# Patient Record
Sex: Female | Born: 2000 | Race: Black or African American | Hispanic: No | Marital: Single | Smoking: Never smoker
Health system: Southern US, Community
[De-identification: ages and names within clinical notes are randomized; demographics above are authoritative.]

## PROBLEM LIST (undated history)

## (undated) DIAGNOSIS — Z789 Other specified health status: Secondary | ICD-10-CM

## (undated) HISTORY — DX: Other specified health status: Z78.9

## (undated) HISTORY — PX: NO PAST SURGERIES: SHX2092

---

## 2020-03-05 ENCOUNTER — Encounter: Payer: Self-pay | Admitting: General Practice

## 2020-03-10 ENCOUNTER — Ambulatory Visit (INDEPENDENT_AMBULATORY_CARE_PROVIDER_SITE_OTHER): Payer: Self-pay | Admitting: *Deleted

## 2020-03-10 ENCOUNTER — Other Ambulatory Visit: Payer: Self-pay

## 2020-03-10 ENCOUNTER — Encounter: Payer: Self-pay | Admitting: General Practice

## 2020-03-10 VITALS — BP 119/74 | HR 89 | Temp 98.2°F | Ht 65.0 in | Wt 172.1 lb

## 2020-03-10 DIAGNOSIS — Z3687 Encounter for antenatal screening for uncertain dates: Secondary | ICD-10-CM

## 2020-03-10 DIAGNOSIS — Z3400 Encounter for supervision of normal first pregnancy, unspecified trimester: Secondary | ICD-10-CM

## 2020-03-10 NOTE — Progress Notes (Signed)
   PRENATAL INTAKE SUMMARY  Ms. Whicker presents today New OB Nurse Interview.  OB History    Gravida  1   Para      Term      Preterm      AB      Living        SAB      TAB      Ectopic      Multiple      Live Births             I have reviewed the patient's medical, obstetrical, social, and family histories, medications, and available lab results.  SUBJECTIVE She has no unusual complaints.  OBJECTIVE Initial nurse interview for history/labs (New OB).  EDD: unsure of LMP think is was around 12/02/19 GA: around [redacted]w[redacted]d G1P0 FHT: 165  GENERAL APPEARANCE: alert, well appearing, in no apparent distress, oriented to person, place and time   ASSESSMENT Normal pregnancy  PLAN Prenatal care OB Pnl/HIV  OB Urine Culture GC/CT/PAP to be completed at next visit with Steward Drone, CNM HgbEval/SMA/CF([facility]) to be completed when Medicaid is approved. A1C Ultrasound OB ordered to confirm dating/viability RX for BP and weight scale will be sent when medicaid is approved Continue PNV  Clovis Pu, RN

## 2020-03-10 NOTE — Patient Instructions (Addendum)
Second Trimester of Pregnancy  The second trimester is from week 14 through week 27 (month 4 through 6). This is often the time in pregnancy that you feel your best. Often times, morning sickness has lessened or quit. You may have more energy, and you may get hungry more often. Your unborn baby is growing rapidly. At the end of the sixth month, he or she is about 9 inches long and weighs about 1 pounds. You will likely feel the baby move between 18 and 20 weeks of pregnancy. Follow these instructions at home: Medicines  Take over-the-counter and prescription medicines only as told by your doctor. Some medicines are safe and some medicines are not safe during pregnancy.  Take a prenatal vitamin that contains at least 600 micrograms (mcg) of folic acid.  If you have trouble pooping (constipation), take medicine that will make your stool soft (stool softener) if your doctor approves. Eating and drinking   Eat regular, healthy meals.  Avoid raw meat and uncooked cheese.  If you get low calcium from the food you eat, talk to your doctor about taking a daily calcium supplement.  Avoid foods that are high in fat and sugars, such as fried and sweet foods.  If you feel sick to your stomach (nauseous) or throw up (vomit): ? Eat 4 or 5 small meals a day instead of 3 large meals. ? Try eating a few soda crackers. ? Drink liquids between meals instead of during meals.  To prevent constipation: ? Eat foods that are high in fiber, like fresh fruits and vegetables, whole grains, and beans. ? Drink enough fluids to keep your pee (urine) clear or pale yellow. Activity  Exercise only as told by your doctor. Stop exercising if you start to have cramps.  Do not exercise if it is too hot, too humid, or if you are in a place of great height (high altitude).  Avoid heavy lifting.  Wear low-heeled shoes. Sit and stand up straight.  You can continue to have sex unless your doctor tells you not  to. Relieving pain and discomfort  Wear a good support bra if your breasts are tender.  Take warm water baths (sitz baths) to soothe pain or discomfort caused by hemorrhoids. Use hemorrhoid cream if your doctor approves.  Rest with your legs raised if you have leg cramps or low back pain.  If you develop puffy, bulging veins (varicose veins) in your legs: ? Wear support hose or compression stockings as told by your doctor. ? Raise (elevate) your feet for 15 minutes, 3-4 times a day. ? Limit salt in your food. Prenatal care  Write down your questions. Take them to your prenatal visits.  Keep all your prenatal visits as told by your doctor. This is important. Safety  Wear your seat belt when driving.  Make a list of emergency phone numbers, including numbers for family, friends, the hospital, and police and fire departments. General instructions  Ask your doctor about the right foods to eat or for help finding a counselor, if you need these services.  Ask your doctor about local prenatal classes. Begin classes before month 6 of your pregnancy.  Do not use hot tubs, steam rooms, or saunas.  Do not douche or use tampons or scented sanitary pads.  Do not cross your legs for long periods of time.  Visit your dentist if you have not done so. Use a soft toothbrush to brush your teeth. Floss gently.  Avoid all  smoking, herbs, and alcohol. Avoid drugs that are not approved by your doctor.  Do not use any products that contain nicotine or tobacco, such as cigarettes and e-cigarettes. If you need help quitting, ask your doctor.  Avoid cat litter boxes and soil used by cats. These carry germs that can cause birth defects in the baby and can cause a loss of your baby (miscarriage) or stillbirth. Contact a doctor if:  You have mild cramps or pressure in your lower belly.  You have pain when you pee (urinate).  You have bad smelling fluid coming from your vagina.  You continue to  feel sick to your stomach (nauseous), throw up (vomit), or have watery poop (diarrhea).  You have a nagging pain in your belly area.  You feel dizzy. Get help right away if:  You have a fever.  You are leaking fluid from your vagina.  You have spotting or bleeding from your vagina.  You have severe belly cramping or pain.  You lose or gain weight rapidly.  You have trouble catching your breath and have chest pain.  You notice sudden or extreme puffiness (swelling) of your face, hands, ankles, feet, or legs.  You have not felt the baby move in over an hour.  You have severe headaches that do not go away when you take medicine.  You have trouble seeing. Summary  The second trimester is from week 14 through week 27 (months 4 through 6). This is often the time in pregnancy that you feel your best.  To take care of yourself and your unborn baby, you will need to eat healthy meals, take medicines only if your doctor tells you to do so, and do activities that are safe for you and your baby.  Call your doctor if you get sick or if you notice anything unusual about your pregnancy. Also, call your doctor if you need help with the right food to eat, or if you want to know what activities are safe for you. This information is not intended to replace advice given to you by your health care provider. Make sure you discuss any questions you have with your health care provider. Document Revised: 09/21/2018 Document Reviewed: 07/05/2016 Elsevier Patient Education  2020 [employer].  Warning Signs During Pregnancy A pregnancy lasts about 40 weeks, starting from the first day of your last period until the baby is born. Pregnancy is divided into three phases called trimesters.  The first trimester refers to week 1 through week 13 of pregnancy.  The second trimester is the start of week 14 through the end of week 27.  The third trimester is the start of week 28 until you deliver your  baby. During each trimester of pregnancy, certain signs and symptoms may indicate a problem. Talk with your health care provider about your current health and any medical conditions you have. Make sure you know the symptoms that you should watch for and report. How does this affect me?  Warning signs in the first trimester While some changes during the first trimester may be uncomfortable, most do not represent a serious problem. Let your health care provider know if you have any of the following warning signs in the first trimester:  You cannot eat or drink without vomiting, and this lasts for longer than a day.  You have vaginal bleeding or spotting along with menstrual-like cramping.  You have diarrhea for longer than a day.  You have a fever or other signs of  infection, such as: ? Pain or burning when you urinate. ? Foul smelling or thick or yellowish vaginal discharge. Warning signs in the second trimester As your baby grows and changes during the second trimester, there are additional signs and symptoms that may indicate a problem. These include:  Signs and symptoms of infection, including a fever.  Signs or symptoms of a miscarriage or preterm labor, such as regular contractions, menstrual-like cramping, or lower abdominal pain.  Bloody or watery vaginal discharge or obvious vaginal bleeding.  Feeling like your heart is pounding.  Having trouble breathing.  Nausea, vomiting, or diarrhea that lasts for longer than a day.  Craving non-food items, such as clay, chalk, or dirt. This may be a sign of a very treatable medical condition called pica. Later in your second trimester, watch for signs and symptoms of a serious medical condition called preeclampsia.These include:  Changes in your vision.  A severe headache that does not go away.  Nausea and vomiting. It is also important to notice if your baby stops moving or moves less than usual during this time. Warning signs in  the third trimester As you approach the third trimester, your baby is growing and your body is preparing for the birth of your baby. In your third trimester, be sure to let your health care provider know if:  You have signs and symptoms of infection, including a fever.  You have vaginal bleeding.  You notice that your baby is moving less than usual or is not moving.  You have nausea, vomiting, or diarrhea that lasts for longer than a day.  You have a severe headache that does not go away.  You have vision changes, including seeing spots or having blurry or double vision.  You have increased swelling in your hands or face. How does this affect my baby? Throughout your pregnancy, always report any of the warning signs of a problem to your health care provider. This can help prevent complications that may affect your baby, including:  Increased risk for premature birth.  Infection that may be transmitted to your baby.  Increased risk for stillbirth. Contact a health care provider if:  You have any of the warning signs of a problem for the current trimester of your pregnancy.  Any of the following apply to you during any trimester of pregnancy: ? You have strong emotions, such as sadness or anxiety, that interfere with work or personal relationships. ? You feel unsafe in your home and need help finding a safe place to live. ? You are using tobacco products, alcohol, or drugs and you need help to stop. Get help right away if: You have signs or symptoms of labor before 37 weeks of pregnancy. These include:  Contractions that are 5 minutes or less apart, or that increase in frequency, intensity, or length.  Sudden, sharp abdominal pain or low back pain.  Uncontrolled gush or trickle of fluid from your vagina. Summary  A pregnancy lasts about 40 weeks, starting from the first day of your last period until the baby is born. Pregnancy is divided into three phases called trimesters.  Each trimester has warning signs to watch for.  Always report any warning signs to your health care provider in order to prevent complications that may affect both you and your baby.  Talk with your health care provider about your current health and any medical conditions you have. Make sure you know the symptoms that you should watch for and report. This information  is not intended to replace advice given to you by your health care provider. Make sure you discuss any questions you have with your health care provider. Document Revised: 09/18/2018 Document Reviewed: 03/16/2017 Elsevier Patient Education  2020 [employer].  How to Take Your Blood Pressure You can take your blood pressure at home with a machine. You may need to check your blood pressure at home:  To check if you have high blood pressure (hypertension).  To check your blood pressure over time.  To make sure your blood pressure medicine is working. Supplies needed: You will need a blood pressure machine, or monitor. You can buy one at a drugstore or online. When choosing one:  Choose one with an arm cuff.  Choose one that wraps around your upper arm. Only one finger should fit between your arm and the cuff.  Do not choose one that measures your blood pressure from your wrist or finger. Your doctor can suggest a monitor. How to prepare Avoid these things for 30 minutes before checking your blood pressure:  Drinking caffeine.  Drinking alcohol.  Eating.  Smoking.  Exercising. Five minutes before checking your blood pressure:  Pee.  Sit in a dining chair. Avoid sitting in a soft couch or armchair.  Be quiet. Do not talk. How to take your blood pressure Follow the instructions that came with your machine. If you have a digital blood pressure monitor, these may be the instructions: 1. Sit up straight. 2. Place your feet on the floor. Do not cross your ankles or legs. 3. Rest your left arm at the level of your  heart. You may rest it on a table, desk, or chair. 4. Pull up your shirt sleeve. 5. Wrap the blood pressure cuff around the upper part of your left arm. The cuff should be 1 inch (2.5 cm) above your elbow. It is best to wrap the cuff around bare skin. 6. Fit the cuff snugly around your arm. You should be able to place only one finger between the cuff and your arm. 7. Put the cord inside the groove of your elbow. 8. Press the power button. 9. Sit quietly while the cuff fills with air and loses air. 10. Write down the numbers on the screen. 11. Wait 2-3 minutes and then repeat steps 1-10. What do the numbers mean? Two numbers make up your blood pressure. The first number is called systolic pressure. The second is called diastolic pressure. An example of a blood pressure reading is "120 over 80" (or 120/80). If you are an adult and do not have a medical condition, use this guide to find out if your blood pressure is normal: Normal  First number: below 120.  Second number: below 80. Elevated  First number: 120-129.  Second number: below 80. Hypertension stage 1  First number: 130-139.  Second number: 80-89. Hypertension stage 2  First number: 140 or above.  Second number: 90 or above. Your blood pressure is above normal even if only the top or bottom number is above normal. Follow these instructions at home:  Check your blood pressure as often as your doctor tells you to.  Take your monitor to your next doctor's appointment. Your doctor will: ? Make sure you are using it correctly. ? Make sure it is working right.  Make sure you understand what your blood pressure numbers should be.  Tell your doctor if your medicines are causing side effects. Contact a doctor if:  Your blood pressure keeps  being high. Get help right away if:  Your first blood pressure number is higher than 180.  Your second blood pressure number is higher than 120. This information is not intended to  replace advice given to you by your health care provider. Make sure you discuss any questions you have with your health care provider. Document Revised: 05/12/2017 Document Reviewed: 11/06/2015 Elsevier Patient Education  2020 [employer]  Genetic Testing During Pregnancy Genetic testing during pregnancy is also called prenatal genetic testing. This type of testing can determine if your baby is at risk of being born with a disorder caused by abnormal genes or chromosomes (genetic disorder). Chromosomes contain genes that control how your baby will develop in your womb. There are many different genetic disorders. Examples of genetic disorders that may be found through genetic testing include Down syndrome and cystic fibrosis. Gene changes (mutations) can be passed down through families. Genetic testing is offered to all women before or during pregnancy. You can choose whether to have genetic testing. Why is genetic testing done? Genetic testing is done during pregnancy to find out whether your child is at risk for a genetic disorder. Having genetic testing allows you to:  Discuss your test results and options with a genetic counselor.  Prepare for a baby that may be born with a genetic disorder. Learning about the disorder ahead of time helps you be better prepared to manage it. Your health care providers can also be prepared in case your baby requires special care before or after birth.  Consider whether you want to continue with the pregnancy. In some cases, genetic testing may be done to learn about the traits a child will inherit. Types of genetic tests There are two basic types of genetic testing. Screening tests indicate whether your developing baby (fetus) is at higher risk for a genetic disorder. Diagnostic tests check actual fetal cells to diagnose a genetic disorder. Screening tests     Screening tests will not harm your baby. They are recommended for all pregnant women. Types of  screening tests include:  Carrier screening. This test involves checking genes from both parents by testing their blood or saliva. The test checks to find out if the parents carry a genetic mutation that may be passed to a baby. In most cases, both parents must carry the mutation for a baby to be at risk.  First trimester screening. This test combines a blood test with sound wave imaging of your baby (fetal ultrasound). This screening test checks for a risk of Down syndrome or other defects caused by having extra chromosomes. It also checks for defects of the heart, abdomen, or skeleton.  Second trimester screening also combines a blood test with a fetal ultrasound exam. It checks for a risk of genetic defects of the face, brain, spine, heart, or limbs.  Combined or sequential screening. This type of testing combines the results of first and second trimester screening. This type of testing may be more accurate than first or second trimester screening alone.  Cell-free DNA testing. This is a blood test that detects cells released by the placenta that get into the mother's blood. It can be used to check for a risk of Down syndrome, other extra chromosome syndromes, and disorders caused by abnormal numbers of sex chromosomes. This test can be done any time after 10 weeks of pregnancy.  Diagnostic tests Diagnostic tests carry slight risks of problems, including bleeding, infection, and loss of the pregnancy. These tests are done  only if your baby is at risk for a genetic disorder. You may meet with a genetic counselor to discuss the risks and benefits before having diagnostic tests. Examples of diagnostic tests include:  Chorionic villus sampling (CVS). This involves a procedure to remove and test a sample of cells taken from the placenta. The procedure may be done between 10 and 12 weeks of pregnancy.  Amniocentesis. This involves a procedure to remove and test a sample of fluid (amniotic fluid) and  cells from the sac that surrounds the developing baby. The procedure may be done between 15 and 20 weeks of pregnancy. What do the results mean? For a screening test:  If the results are negative, it often means that your child is not at higher risk. There is still a slight chance your child could have a genetic disorder.  If the results are positive, it does not mean your child will have a genetic disorder. It may mean that your child has a higher-than-normal risk for a genetic disorder. In that case, you may want to talk with a genetic counselor about whether you should have diagnostic genetic tests. For a diagnostic test:  If the result is negative, it is unlikely that your child will have a genetic disorder.  If the test is positive for a genetic disorder, it is likely that your child will have the disorder. The test may not tell how severe the disorder will be. Talk with your health care provider about your options. Questions to ask your health care provider Before talking to your health care provider about genetic testing, find out if there is a history of genetic disorders in your family. It may also help to know your family's ethnic origins. Then ask your health care provider the following questions:  Is my baby at risk for a genetic disorder?  What are the benefits of having genetic screening?  What tests are best for me and my baby?  What are the risks of each test?  If I get a positive result on a screening test, what is the next step?  Should I meet with a genetic counselor before having a diagnostic test?  Should my partner or other members of my family be tested?  How much do the tests cost? Will my insurance cover the testing? Summary  Genetic testing is done during pregnancy to find out whether your child is at risk for a genetic disorder.  Genetic testing is offered to all women before or during pregnancy. You can choose whether to have genetic testing.  There are  two basic types of genetic testing. Screening tests indicate whether your developing baby (fetus) is at higher risk for a genetic disorder. Diagnostic tests check actual fetal cells to diagnose a genetic disorder.  If a diagnostic genetic test is positive, talk with your health care provider about your options. This information is not intended to replace advice given to you by your health care provider. Make sure you discuss any questions you have with your health care provider. Document Revised: 09/20/2018 Document Reviewed: 08/14/2017 Elsevier Patient Education  2020 [employer].

## 2020-03-11 LAB — CBC/D/PLT+RPR+RH+ABO+RUB AB...
Antibody Screen: NEGATIVE
Basophils Absolute: 0.1 10*3/uL (ref 0.0–0.2)
Basos: 1 %
EOS (ABSOLUTE): 0.2 10*3/uL (ref 0.0–0.4)
Eos: 2 %
HCV Ab: <0.1 s/co ratio (ref 0.0–0.9)
HIV Screen 4th Generation wRfx: NONREACTIVE
Hematocrit: 33.2 % — ABNORMAL LOW (ref 34.0–46.6)
Hemoglobin: 10.7 g/dL — ABNORMAL LOW (ref 11.1–15.9)
Hepatitis B Surface Ag: NEGATIVE
Immature Grans (Abs): 0 10*3/uL (ref 0.0–0.1)
Immature Granulocytes: 0 %
Lymphocytes Absolute: 1.9 10*3/uL (ref 0.7–3.1)
Lymphs: 22 %
MCH: 26.6 pg (ref 26.6–33.0)
MCHC: 32.2 g/dL (ref 31.5–35.7)
MCV: 83 fL (ref 79–97)
Monocytes Absolute: 0.5 10*3/uL (ref 0.1–0.9)
Monocytes: 6 %
Neutrophils Absolute: 6 10*3/uL (ref 1.4–7.0)
Neutrophils: 69 %
Platelets: 373 10*3/uL (ref 150–450)
RBC: 4.02 x10E6/uL (ref 3.77–5.28)
RDW: 14.3 % (ref 11.7–15.4)
RPR Ser Ql: NONREACTIVE
Rh Factor: POSITIVE
Rubella Antibodies, IGG: 3.4 index (ref >0.99)
WBC: 8.7 10*3/uL (ref 3.4–10.8)

## 2020-03-11 LAB — HCV INTERPRETATION

## 2020-03-11 LAB — HEMOGLOBIN A1C
Est. average glucose Bld gHb Est-mCnc: 108 mg/dL
Hgb A1c MFr Bld: 5.4 % (ref 4.8–5.6)

## 2020-03-12 LAB — CULTURE, OB URINE

## 2020-03-12 LAB — URINE CULTURE, OB REFLEX

## 2020-03-18 ENCOUNTER — Other Ambulatory Visit: Payer: Medicaid Other

## 2020-04-01 ENCOUNTER — Encounter: Payer: Medicaid Other | Admitting: Certified Nurse Midwife

## 2020-04-17 ENCOUNTER — Other Ambulatory Visit (HOSPITAL_COMMUNITY)
Admission: RE | Admit: 2020-04-17 | Discharge: 2020-04-17 | Disposition: A | Discharge status: Home / Self Care | Payer: Medicaid Other | Source: Ambulatory Visit

## 2020-04-17 ENCOUNTER — Ambulatory Visit (INDEPENDENT_AMBULATORY_CARE_PROVIDER_SITE_OTHER): Payer: Medicaid Other

## 2020-04-17 ENCOUNTER — Other Ambulatory Visit: Payer: Self-pay

## 2020-04-17 ENCOUNTER — Encounter: Payer: Self-pay | Admitting: General Practice

## 2020-04-17 VITALS — BP 135/75 | HR 82 | Wt 175.4 lb

## 2020-04-17 DIAGNOSIS — B3731 Acute candidiasis of vulva and vagina: Secondary | ICD-10-CM

## 2020-04-17 DIAGNOSIS — Z3A19 19 weeks gestation of pregnancy: Secondary | ICD-10-CM

## 2020-04-17 DIAGNOSIS — Z3401 Encounter for supervision of normal first pregnancy, first trimester: Secondary | ICD-10-CM | POA: Insufficient documentation

## 2020-04-17 DIAGNOSIS — B373 Candidiasis of vulva and vagina: Secondary | ICD-10-CM

## 2020-04-17 DIAGNOSIS — Z124 Encounter for screening for malignant neoplasm of cervix: Secondary | ICD-10-CM

## 2020-04-17 MED ORDER — PREPLUS 27-1 MG PO TABS: 1.0000 | ORAL_TABLET | Freq: Every day | ORAL | 13 refills | Status: AC

## 2020-04-17 MED ORDER — TERCONAZOLE 0.4 % VA CREA: 1.0000 | TOPICAL_CREAM | Freq: Every day | VAGINAL | 0 refills | Status: DC

## 2020-04-17 MED ORDER — ASPIRIN EC 81 MG PO TBEC: 81.0000 mg | DELAYED_RELEASE_TABLET | Freq: Every day | ORAL | 2 refills | Status: AC

## 2020-04-17 MED ORDER — BLOOD PRESSURE KIT DEVI: 0 refills | Status: AC

## 2020-04-17 NOTE — Patient Instructions (Addendum)
Glucose Tolerance Test During Pregnancy Why am I having this test? The glucose tolerance test (GTT) is done to check how your body processes sugar (glucose). This is one of several tests used to diagnose diabetes that develops during pregnancy (gestational diabetes mellitus). Gestational diabetes is a temporary form of diabetes that some women develop during pregnancy. It usually occurs during the second trimester of pregnancy and goes away after delivery. Testing (screening) for gestational diabetes usually occurs between 24 and 28 weeks of pregnancy. You may have the GTT test after having a 1-hour glucose screening test if the results from that test indicate that you may have gestational diabetes. You may also have this test if:  You have a history of gestational diabetes.  You have a history of giving birth to very large babies or have experienced repeated fetal loss (stillbirth).  You have signs and symptoms of diabetes, such as: ? Changes in your vision. ? Tingling or numbness in your hands or feet. ? Changes in hunger, thirst, and urination that are not otherwise explained by your pregnancy. What is being tested? This test measures the amount of glucose in your blood at different times during a period of 3 hours. This indicates how well your body is able to process glucose. What kind of sample is taken?  Blood samples are required for this test. They are usually collected by inserting a needle into a blood vessel. How do I prepare for this test?  For 3 days before your test, eat normally. Have plenty of carbohydrate-rich foods.  Follow instructions from your health care provider about: ? Eating or drinking restrictions on the day of the test. You may be asked to not eat or drink anything other than water (fast) starting 8-10 hours before the test. ? Changing or stopping your regular medicines. Some medicines may interfere with this test. Tell a health care provider about:  All  medicines you are taking, including vitamins, herbs, eye drops, creams, and over-the-counter medicines.  Any blood disorders you have.  Any surgeries you have had.  Any medical conditions you have. What happens during the test? First, your blood glucose will be measured. This is referred to as your fasting blood glucose, since you fasted before the test. Then, you will drink a glucose solution that contains a certain amount of glucose. Your blood glucose will be measured again 1, 2, and 3 hours after drinking the solution. This test takes about 3 hours to complete. You will need to stay at the testing location during this time. During the testing period:  Do not eat or drink anything other than the glucose solution.  Do not exercise.  Do not use any products that contain nicotine or tobacco, such as cigarettes and e-cigarettes. If you need help stopping, ask your health care provider. The testing procedure may vary among health care providers and hospitals. How are the results reported? Your results will be reported as milligrams of glucose per deciliter of blood (mg/dL) or millimoles per liter (mmol/L). Your health care provider will compare your results to normal ranges that were established after testing a large group of people (reference ranges). Reference ranges may vary among labs and hospitals. For this test, common reference ranges are:  Fasting: less than 95-105 mg/dL (5.3-5.8 mmol/L).  1 hour after drinking glucose: less than 180-190 mg/dL (10.0-10.5 mmol/L).  2 hours after drinking glucose: less than 155-165 mg/dL (8.6-9.2 mmol/L).  3 hours after drinking glucose: 140-145 mg/dL (7.8-8.1 mmol/L). What do the   results mean? Results within reference ranges are considered normal, meaning that your glucose levels are well-controlled. If two or more of your blood glucose levels are high, you may be diagnosed with gestational diabetes. If only one level is high, your health care  provider may suggest repeat testing or other tests to confirm a diagnosis. Talk with your health care provider about what your results mean. Questions to ask your health care provider Ask your health care provider, or the department that is doing the test:  When will my results be ready?  How will I get my results?  What are my treatment options?  What other tests do I need?  What are my next steps? Summary  The glucose tolerance test (GTT) is one of several tests used to diagnose diabetes that develops during pregnancy (gestational diabetes mellitus). Gestational diabetes is a temporary form of diabetes that some women develop during pregnancy.  You may have the GTT test after having a 1-hour glucose screening test if the results from that test indicate that you may have gestational diabetes. You may also have this test if you have any symptoms or risk factors for gestational diabetes.  Talk with your health care provider about what your results mean. This information is not intended to replace advice given to you by your health care provider. Make sure you discuss any questions you have with your health care provider. Document Revised: 09/20/2018 Document Reviewed: 01/09/2017 Elsevier Patient Education  2020 Else[employer] Inc.  Iron-Rich Diet  Iron is a mineral that helps your body to produce hemoglobin. Hemoglobin is a protein in red blood cells that carries oxygen to your body's tissues. Eating too little iron may cause you to feel weak and tired, and it can increase your risk of infection. Iron is naturally found in many foods, and many foods have iron added to them (iron-fortified foods). You may need to follow an iron-rich diet if you do not have enough iron in your body due to certain medical conditions. The amount of iron that you need each day depends on your age, your sex, and any medical conditions you have. Follow instructions from your health care provider or a diet and  nutrition specialist (dietitian) about how much iron you should eat each day. What are tips for following this plan? Reading food labels  Check food labels to see how many milligrams (mg) of iron are in each serving. Cooking  Cook foods in pots and pans that are made from iron.  Take these steps to make it easier for your body to absorb iron from certain foods: ? Soak beans overnight before cooking. ? Soak whole grains overnight and drain them before using. ? Ferment flours before baking, such as by using yeast in bread dough. Meal planning  When you eat foods that contain iron, you should eat them with foods that are high in vitamin C. These include oranges, peppers, tomatoes, potatoes, and mango. Vitamin C helps your body to absorb iron. General information  Take iron supplements only as told by your health care provider. An overdose of iron can be life-threatening. If you were prescribed iron supplements, take them with orange juice or a vitamin C supplement.  When you eat iron-fortified foods or take an iron supplement, you should also eat foods that naturally contain iron, such as meat, poultry, and fish. Eating naturally iron-rich foods helps your body to absorb the iron that is added to other foods or contained in a supplement.  Certain  foods and drinks prevent your body from absorbing iron properly. Avoid eating these foods in the same meal as iron-rich foods or with iron supplements. These foods include: ? Coffee, black tea, and red wine. ? Milk, dairy products, and foods that are high in calcium. ? Beans and soybeans. ? Whole grains. What foods should I eat? Fruits Prunes. Raisins. Eat fruits high in vitamin C, such as oranges, grapefruits, and strawberries, alongside iron-rich foods. Vegetables Spinach (cooked). Green peas. Broccoli. Fermented vegetables. Eat vegetables high in vitamin C, such as leafy greens, potatoes, bell peppers, and tomatoes, alongside iron-rich  foods. Grains Iron-fortified breakfast cereal. Iron-fortified whole-wheat bread. Enriched rice. Sprouted grains. Meats and other proteins Beef liver. Oysters. Beef. Shrimp. [place]. Chicken. Tuna. Sardines. Chickpeas. Nuts. Tofu. Pumpkin seeds. Beverages Tomato juice. Fresh orange juice. Prune juice. Hibiscus tea. Fortified instant breakfast shakes. Sweets and desserts Blackstrap molasses. Seasonings and condiments Tahini. Fermented soy sauce. Other foods Wheat germ. The items listed above may not be a complete list of recommended foods and beverages. Contact a dietitian for more information. What foods should I avoid? Grains Whole grains. Bran cereal. Bran flour. Oats. Meats and other proteins Soybeans. Products made from soy protein. Black beans. Lentils. Mung beans. Split peas. Dairy Milk. Cream. Cheese. Yogurt. Cottage cheese. Beverages Coffee. Black tea. Red wine. Sweets and desserts Cocoa. Chocolate. Ice cream. Other foods Basil. Oregano. Large amounts of parsley. The items listed above may not be a complete list of foods and beverages to avoid. Contact a dietitian for more information. Summary  Iron is a mineral that helps your body to produce hemoglobin. Hemoglobin is a protein in red blood cells that carries oxygen to your body's tissues.  Iron is naturally found in many foods, and many foods have iron added to them (iron-fortified foods).  When you eat foods that contain iron, you should eat them with foods that are high in vitamin C. Vitamin C helps your body to absorb iron.  Certain foods and drinks prevent your body from absorbing iron properly, such as whole grains and dairy products. You should avoid eating these foods in the same meal as iron-rich foods or with iron supplements. This information is not intended to replace advice given to you by your health care provider. Make sure you discuss any questions you have with your health care provider. Document Revised:  05/12/2017 Document Reviewed: 04/25/2017 Elsevier Patient Education  2020 [employer].
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
## 2020-04-19 LAB — AFP, SERUM, OPEN SPINA BIFIDA
AFP MoM: 1.75
AFP Value: 74 ng/mL
Gest. Age on Collection Date: 19.4 weeks
Maternal Age At EDD: 22.1 yr
OSBR Risk 1 IN: 861
Test Results:: NEGATIVE
Weight: 174 [lb_av]

## 2020-04-21 ENCOUNTER — Encounter: Payer: Self-pay | Admitting: General Practice

## 2020-04-21 LAB — CYTOLOGY - PAP
Comment: NEGATIVE
Diagnosis: NEGATIVE
High risk HPV: NEGATIVE

## 2020-04-21 LAB — CERVICOVAGINAL ANCILLARY ONLY
Bacterial Vaginitis (gardnerella): NEGATIVE
Candida Glabrata: NEGATIVE
Candida Vaginitis: POSITIVE — AB
Chlamydia: NEGATIVE
Comment: NEGATIVE
Comment: NEGATIVE
Comment: NEGATIVE
Comment: NEGATIVE
Comment: NEGATIVE
Comment: NORMAL
Neisseria Gonorrhea: NEGATIVE
Trichomonas: NEGATIVE

## 2020-05-13 ENCOUNTER — Other Ambulatory Visit: Payer: Self-pay

## 2020-05-13 ENCOUNTER — Ambulatory Visit: Payer: Medicaid Other

## 2020-05-13 DIAGNOSIS — Z3401 Encounter for supervision of normal first pregnancy, first trimester: Secondary | ICD-10-CM | POA: Insufficient documentation

## 2020-05-14 ENCOUNTER — Other Ambulatory Visit: Payer: Self-pay | Admitting: *Deleted

## 2020-05-14 DIAGNOSIS — Z362 Encounter for other antenatal screening follow-up: Secondary | ICD-10-CM

## 2020-05-15 ENCOUNTER — Telehealth (INDEPENDENT_AMBULATORY_CARE_PROVIDER_SITE_OTHER): Payer: Medicaid Other

## 2020-05-15 DIAGNOSIS — Z3A23 23 weeks gestation of pregnancy: Secondary | ICD-10-CM

## 2020-05-15 DIAGNOSIS — Z3402 Encounter for supervision of normal first pregnancy, second trimester: Secondary | ICD-10-CM

## 2020-05-15 DIAGNOSIS — Z3400 Encounter for supervision of normal first pregnancy, unspecified trimester: Secondary | ICD-10-CM

## 2020-05-15 MED ORDER — GOJJI WEIGHT SCALE MISC: 1.0000 | Freq: Every day | 0 refills | Status: AC | PRN

## 2020-05-15 MED ORDER — TERCONAZOLE 0.4 % VA CREA
1.0000 | TOPICAL_CREAM | Freq: Every day | VAGINAL | 0 refills | Status: DC
Start: 2020-05-15 — End: 2020-06-10

## 2020-05-15 NOTE — Patient Instructions (Signed)
Glucose Tolerance Test During Pregnancy Why am I having this test? The glucose tolerance test (GTT) is done to check how your body processes sugar (glucose). This is one of several tests used to diagnose diabetes that develops during pregnancy (gestational diabetes mellitus). Gestational diabetes is a temporary form of diabetes that some women develop during pregnancy. It usually occurs during the second trimester of pregnancy and goes away after delivery. Testing (screening) for gestational diabetes usually occurs between 24 and 28 weeks of pregnancy. You may have the GTT test after having a 1-hour glucose screening test if the results from that test indicate that you may have gestational diabetes. You may also have this test if:  You have a history of gestational diabetes.  You have a history of giving birth to very large babies or have experienced repeated fetal loss (stillbirth).  You have signs and symptoms of diabetes, such as: ? Changes in your vision. ? Tingling or numbness in your hands or feet. ? Changes in hunger, thirst, and urination that are not otherwise explained by your pregnancy. What is being tested? This test measures the amount of glucose in your blood at different times during a period of 3 hours. This indicates how well your body is able to process glucose. What kind of sample is taken?  Blood samples are required for this test. They are usually collected by inserting a needle into a blood vessel. How do I prepare for this test?  For 3 days before your test, eat normally. Have plenty of carbohydrate-rich foods.  Follow instructions from your health care provider about: ? Eating or drinking restrictions on the day of the test. You may be asked to not eat or drink anything other than water (fast) starting 8-10 hours before the test. ? Changing or stopping your regular medicines. Some medicines may interfere with this test. Tell a health care provider about:  All  medicines you are taking, including vitamins, herbs, eye drops, creams, and over-the-counter medicines.  Any blood disorders you have.  Any surgeries you have had.  Any medical conditions you have. What happens during the test? First, your blood glucose will be measured. This is referred to as your fasting blood glucose, since you fasted before the test. Then, you will drink a glucose solution that contains a certain amount of glucose. Your blood glucose will be measured again 1, 2, and 3 hours after drinking the solution. This test takes about 3 hours to complete. You will need to stay at the testing location during this time. During the testing period:  Do not eat or drink anything other than the glucose solution.  Do not exercise.  Do not use any products that contain nicotine or tobacco, such as cigarettes and e-cigarettes. If you need help stopping, ask your health care provider. The testing procedure may vary among health care providers and hospitals. How are the results reported? Your results will be reported as milligrams of glucose per deciliter of blood (mg/dL) or millimoles per liter (mmol/L). Your health care provider will compare your results to normal ranges that were established after testing a large group of people (reference ranges). Reference ranges may vary among labs and hospitals. For this test, common reference ranges are:  Fasting: less than 95-105 mg/dL (5.3-5.8 mmol/L).  1 hour after drinking glucose: less than 180-190 mg/dL (10.0-10.5 mmol/L).  2 hours after drinking glucose: less than 155-165 mg/dL (8.6-9.2 mmol/L).  3 hours after drinking glucose: 140-145 mg/dL (7.8-8.1 mmol/L). What do the   results mean? Results within reference ranges are considered normal, meaning that your glucose levels are well-controlled. If two or more of your blood glucose levels are high, you may be diagnosed with gestational diabetes. If only one level is high, your health care  provider may suggest repeat testing or other tests to confirm a diagnosis. Talk with your health care provider about what your results mean. Questions to ask your health care provider Ask your health care provider, or the department that is doing the test:  When will my results be ready?  How will I get my results?  What are my treatment options?  What other tests do I need?  What are my next steps? Summary  The glucose tolerance test (GTT) is one of several tests used to diagnose diabetes that develops during pregnancy (gestational diabetes mellitus). Gestational diabetes is a temporary form of diabetes that some women develop during pregnancy.  You may have the GTT test after having a 1-hour glucose screening test if the results from that test indicate that you may have gestational diabetes. You may also have this test if you have any symptoms or risk factors for gestational diabetes.  Talk with your health care provider about what your results mean. This information is not intended to replace advice given to you by your health care provider. Make sure you discuss any questions you have with your health care provider. Document Revised: 09/20/2018 Document Reviewed: 01/09/2017 Elsevier Patient Education  2020 Else[employer] Inc.  

## 2020-05-15 NOTE — Progress Notes (Signed)
   TELEHEALTH OBSTETRICS VISIT ENCOUNTER NOTE  I connected with Daney Tassin on 05/15/20 at 10:30 AM EST by telephone at home and verified that I am speaking with the correct person using two identifiers.   I discussed the limitations, risks, security and privacy concerns of performing an evaluation and management service by telephone and the availability of in person appointments. I also discussed with the patient that there may be a patient responsible charge related to this service. The patient expressed understanding and agreed to proceed.  Subjective:  Marlicia Sroka is a 19 y.o. G1P0 at [redacted]w[redacted]d being followed for ongoing prenatal care.  She is currently monitored for the following issues for this low-risk pregnancy and has Supervision of normal first pregnancy, antepartum on their problem list.  Patient reports no complaints. Reports fetal movement. Denies any contractions, bleeding or leaking of fluid.   The following portions of the patient's history were reviewed and updated as appropriate: allergies, current medications, past family history, past medical history, past social history, past surgical history and problem list.   Objective:   General:  Alert, oriented and cooperative.   Mental Status: Normal mood and affect perceived. Normal judgment and thought content.  Rest of physical exam deferred due to type of encounter  Assessment and Plan:  Pregnancy: G1P0 at [redacted]w[redacted]d  1. Supervision of normal first pregnancy, antepartum -Anticipatory guidance for upcoming appts.  -Reviewed Glucola appt preparation including fasting the night before and morning of.   -Discussed anticipated office time of 2.5-3 hours.  -Reviewed blood draw procedures and labs which also include check of iron level.  -Discussed how results of GTT are handled including diabetic education and BS testing for abnormal results and routine care for normal results.   - Misc. Devices (GOJJI WEIGHT SCALE) MISC; 1  Device by Does not apply route daily as needed. To weight self daily as needed at home. ICD-10 code: O72.90  Dispense: 1 each; Refill: 0  2. [redacted] weeks gestation of pregnancy -Doing well  Preterm labor symptoms and general obstetric precautions including but not limited to vaginal bleeding, contractions, leaking of fluid and fetal movement were reviewed in detail with the patient.  I discussed the assessment and treatment plan with the patient. The patient was provided an opportunity to ask questions and all were answered. The patient agreed with the plan and demonstrated an understanding of the instructions. The patient was advised to call back or seek an in-person office evaluation/go to [facility] at [facility] for any urgent or concerning symptoms. Please refer to After Visit Summary for other counseling recommendations.   I provided 10 minutes of non-face-to-face time during this encounter.  No follow-ups on file.  Future Appointments  Date Time Provider Department Center  06/15/2020 11:00 AM [facility] NURSE [facility]  06/15/2020 11:15 AM [facility] US2 [facility]    Cherre Robins, CNM [facility] for [employer], [facility]

## 2020-06-10 ENCOUNTER — Other Ambulatory Visit: Payer: Self-pay

## 2020-06-10 ENCOUNTER — Encounter: Payer: Self-pay | Admitting: Advanced Practice Midwife

## 2020-06-10 ENCOUNTER — Ambulatory Visit (INDEPENDENT_AMBULATORY_CARE_PROVIDER_SITE_OTHER): Payer: Medicaid Other | Admitting: Advanced Practice Midwife

## 2020-06-10 VITALS — BP 123/70 | HR 109 | Temp 98.1°F | Wt 190.4 lb

## 2020-06-10 DIAGNOSIS — Z3400 Encounter for supervision of normal first pregnancy, unspecified trimester: Secondary | ICD-10-CM

## 2020-06-10 DIAGNOSIS — Z3A28 28 weeks gestation of pregnancy: Secondary | ICD-10-CM

## 2020-06-10 MED ORDER — FAMOTIDINE 40 MG PO TABS: 40.0000 mg | ORAL_TABLET | Freq: Every day | ORAL | 11 refills | Status: AC

## 2020-06-10 NOTE — Patient Instructions (Signed)
COVID-19 Vaccination if You Are Pregnant or Breastfeeding ° °The Society for Maternal-Fetal Medicine (SMFM) and other pregnancy experts recommend that pregnant and lactating people be vaccinated against COVID-19. The Centers for Disease Control and Prevention (CDC) also recommend vaccination for “all people aged 21 years and older, including people who are pregnant, breastfeeding, trying to get pregnant now, or might become pregnant in the future.” Vaccination is the best way to reduce the risks of COVID-19 infection and COVID-related complications for both you and your baby. ° °Three vaccines are available to prevent COVID-19: °• The two-dose Pfizer vaccine for people 12 years and older--APPROVED by the [place] Food and Drug Administration on February 03, 2020 °• The two-dose Moderna vaccine for people 18 years and older--AUTHORIZED for emergency use °• The one-dose Johnson & Johnson vaccine for people 18 years and older (you may also see this vaccine referred to as the “Janssen vaccine”)--AUTHORIZED for emergency use ° °For those receiving the Pfizer and Moderna vaccines, the second dose is given 21 days (Pfizer) and 28 days (Moderna) after the first dose. The Johnson & Johnson vaccine is only one dose. ° °Information for Pregnant Individuals °If you are pregnant or planning to become pregnant and are thinking about getting vaccinated, consider talking with your health care professional about the vaccine.  ° °To help with your decision, you should consider the following key points: °Anyone can get the COVID vaccines free of charge regardless of immigration status or whether they have insurance. You may be asked for your social security number, but it is  °NOT required to get vaccinated. ° °What are benefits of getting the COVID-19 vaccines during pregnancy?  °• The vaccines can help protect you from getting COVID-19. With the two-dose vaccines, you must get both doses for maximum effectiveness. It’s not yet known how  long protection lasts. ° °• Another potential benefit is that getting the vaccine while pregnant may help you pass antiCOVID-19 antibodies to your baby. In numerous studies of vaccinated moms, antibodies were found in the umbilical cord blood of babies and in the mother’s breastmilk. ° °• The CDC, along with other federal partners, are monitoring people who have been vaccinated for serious side effects. So far, more than 139,000 pregnant people have been °vaccinated. No unexpected pregnancy or fetal problems have occurred. There have been no reports of any increased risk of pregnancy loss, growth problems, or birth defects. ° °• A safe vaccine is generally considered one in which the benefits of being vaccinated outweigh the risks. The current vaccines are not live vaccines. There is only a very small  °chance that they cross the placenta, so it’s unlikely that they even reach the fetus. Vaccines don’t affect future fertility. The only people who should NOT get vaccinated are those who have had a severe allergic reaction to vaccines in the past or any vaccine ingredients. ° °• Side effects may occur in the first 3 days after getting vaccinated.1 These include mild to moderate fever, headache, and muscle aches. Side effects may be worse after the second dose of the Pfizer and Moderna vaccines. Fever should be avoided during pregnancy,especially in the first trimester. Those who develop a fever after vaccination can take °acetaminophen (Tylenol). This medication is safe to use during pregnancy and does not  affect how the vaccine works.  ° °What are the known risks of getting COVID-19 during pregnancy?  °About 1 to 3 per 1,000 pregnant women with COVID-19 will develop severe disease. Compared with those who   aren’t pregnant, pregnant people infected by the COVID-19 virus: °• Are 3 times more likely to need ICU care °• Are 2 to 3 times more likely to need advanced life support and a breathing tube  °• Have a small  increased risk of dying due to COVID-19 °They may also be at increased risk of stillbirth and preterm birth. ° °What is my risk of getting COVID-19?  °Your risk of getting COVID-19 depends on the chance that you will come into contact with another infected person. The risk may be higher if you live in a community where there is a lot of COVID-19 infection or work in healthcare or another high-contact setting.  ° °What is my risk for severe complications if I get COVID-19?  °Data show that older pregnant women; those with preexisting health conditions, such as a body mass index higher than 35 kg/m2, diabetes, and heart disorders; and Black or Latinx women have an especially increased risk of severe disease and death from COVID-19. °  °If you still have questions about the vaccines or need more information, ask your health care provider or go to the Centers for Disease Control and Prevention’s COVID-19 vaccine webpage.  ° °An Update on the Johnson & Johnson Vaccine  ° °In April 2021, the FDA and CDC called for a brief pause to use of the Johnson & Johnson vaccine. They did so after reports of a severe side effect in a very small number of women younger than age 50 following vaccination. This side effect, called thrombosis with thrombocytopenia syndrome (TTS), causes blood clots (thrombosis) combined with low levels of platelets (thrombocytopenia). ° °TTS following the Johnson & Johnson vaccine is extremely rare. At the time of this update, it has occurred in only 7 people per 1 million Johnson & Johnson shots given. According to the CDC, being on hormonal birth control (the pill, patch, or ring), pregnancy, breastfeeding, or being recently pregnant does not make you more likely to develop TTS after getting the Johnson & Johnson vaccine. The pause was lifted on October 04, 2019, after the FDA and CDC determined that the known benefits of the Johnson & Johnson vaccine far outweigh the risks.  ° °Health care professionals  have been alerted to the possibility of this side effect in people who have received the Johnson & Johnson vaccine. National organizations continue to recommend COVID-19 vaccination with any of the vaccines for pregnant women. All women younger than age 50 years, whether pregnant, breastfeeding, or not, should be aware of the very rare risk of TTS after getting the Johnson & Johnson vaccine. The Pfizer and Moderna vaccines don’t have this risk. If you get the Johnson & Johnson vaccine,  °seek medical help right away if you develop any of the following symptoms within 3 weeks of getting your shot: ° °• Severe or persistent headaches or blurred vision °• Shortness of breath °• Chest pain °• Leg swelling °• Persistent abdominal pain °• Easy bruising or tiny blood spots under the skin beyond the injection site ° °Experts continue to collect health and safety information from pregnant people who have been vaccinated. If you have questions about vaccination during pregnancy, visit the CDC website or talk to your health care professional. Information for Breastfeeding/Lactating Individuals The Society for Maternal-Fetal Medicine and other pregnancy experts recommend COVID-19 vaccination for people who are breastfeeding/lactating. You don’t have to delay or stop  °breastfeeding just because you get vaccinated.  ° °Getting Vaccinated  °You can get vaccinated at   any time during pregnancy. The CDC is committed to monitoring the vaccine’s safety for all individuals. Your health professional or vaccine clinic may give you information about enrolling in the v-safe after vaccination health checker (see the box below).Even after you’re fully vaccinated, it is important to follow the CDC’s guidance for wearing a mask indoors in areas where there are substantial or high rates of COVID-19 infection.  ° °What Happens When You Enroll in v-Safe?  °The v-safe after vaccination health checker program lets the CDC check in with you after  your vaccination. At sign-up, you can indicate that you are pregnant. Once you do that, expect the following: °• Someone may call you from the v-safe program to ask initial questions and get more information. °• You may be asked to enroll in the vaccine pregnancy registry, which is collecting information about any effects of the vaccine during pregnancy. This is a great way to help scientists monitor the vaccine’s safety and effectiveness.  ° °References °1. Oliver SE, Gargano JW, Marin M, Wallace M, Curran KG, Chamberland M, et al. The Advisory Committee on Immunization Practices’ Interim Recommendation for Use of Pfizer-BioNTech  °COVID-19 Vaccine -- [place], December 2020. MMWR Morbidity and Mortality Weekly Report 2020;69. °2. FDA Briefing Document. Janssen Ad26.COV2.S Vaccine for the Prevention of COVID-19. 2021. Accessed Aug 16, 2019; Available from: https://www.fda.gov/media/146217/download °3. PFIZER-BIONTECH COVID-19 VACCINE [package insert] [place]: Pfizer and [place], [place]: [employer]iontech;2020. °4. FDA Briefing Document. Moderna COVID-19 Vaccine. 2020. Accessed 2020, Dec 18; Available from: https://www.fda.gov/media/144434/download  °5. Gray KJ, Bordt EA, Atyeo C, Deriso E, Akinwunmi B, Young N, et al. COVID-19 vaccine response in pregnant and lactating women: a cohort study. Am J Obstet Gynecol 2021 Mar 24. °6. Panagiotakopoulos L, Myers TR, Gee J, Lipkind HS, Kharbanda EO, Ryan DS, et al. SARS-CoV-2 Infection Among Hospitalized Pregnant Women: Reasons for Admission and Pregnancy  °Characteristics - [facility], March 1-Nov 10, 2018. MMWR Morb Mortal Wkly Rep 2020 Sep 23;69(38):1355-9. °7. Zambrano LD, Ellington S, Strid P, Galang RR, Oduyebo T, Tong VT, et al. Update: Characteristics of Symptomatic Women of Reproductive Age with Laboratory-Confirmed SARSCoV-2 Infection by Pregnancy Status - [place], January 22-March 16, 2019. MMWR Morb Mortal Wkly Rep 2020 Nov  6;69(44):1641-7. °8. Delahoy MJ, Whitaker M, O'Halloran A, Chai SJ, Kirley PD, Alden N, et al. Characteristics and Maternal and Birth Outcomes of Hospitalized Pregnant Women with Laboratory-Confirmed COVID-19 - COVID-NET, 13 [place], March 1-February 02, 2019. MMWR Morb Mortal Wkly Rep 2020 Sep 25;69(38):1347-54. ° °

## 2020-06-10 NOTE — Progress Notes (Signed)
   PRENATAL VISIT NOTE  Subjective:  Brandy Burgess is a 19 y.o. G1P0 at [redacted]w[redacted]d being seen today for ongoing prenatal care.  She is currently monitored for the following issues for this low-risk pregnancy and has Supervision of normal first pregnancy, antepartum on their problem list.  Patient reports heartburn.  Contractions: Not present. Vag. Bleeding: None.  Movement: Present. Denies leaking of fluid.   The following portions of the patient's history were reviewed and updated as appropriate: allergies, current medications, past family history, past medical history, past social history, past surgical history and problem list.   Objective:   Vitals:   06/10/20 0816  BP: 123/70  Pulse: (!) 109  Temp: 98.1 F (36.7 C)  Weight: 190 lb 6.4 oz (86.4 kg)    Fetal Status: Fetal Heart Rate (bpm): 164 Fundal Height: 29 cm Movement: Present     General:  Alert, oriented and cooperative. Patient is in no acute distress.  Skin: Skin is warm and dry. No rash noted.   Cardiovascular: Normal heart rate noted  Respiratory: Normal respiratory effort, no problems with respiration noted  Abdomen: Soft, gravid, appropriate for gestational age.  Pain/Pressure: Absent     Pelvic: Cervical exam deferred        Extremities: Normal range of motion.  Edema: None  Mental Status: Normal mood and affect. Normal behavior. Normal judgment and thought content.   Assessment and Plan:  Pregnancy: G1P0 at [redacted]w[redacted]d 1. Supervision of normal first pregnancy, antepartum - Genetic Screening - HIV Antibody (routine testing w rflx) - RPR - CBC - Glucose Tolerance, 2 Hours w/1 Hour  2. [redacted] weeks gestation of pregnancy  3. Heartburn and Acid Reflux - RX Pepcid 40mg  daily #30 with 11RF to sent to pharmacy.   Preterm labor symptoms and general obstetric precautions including but not limited to vaginal bleeding, contractions, leaking of fluid and fetal movement were reviewed in detail with the patient. Please refer  to After Visit Summary for other counseling recommendations.   Return in about 2 weeks (around 06/24/2020) for virtual visit .  Future Appointments  Date Time Provider Department Center  06/15/2020 11:00 AM [facility]  06/15/2020 11:15 AM [facility] US2 [facility]   [facility] DNP, CNM  06/10/20  8:54 AM

## 2020-06-11 LAB — GLUCOSE TOLERANCE, 2 HOURS W/ 1HR
Glucose, 1 hour: 141 mg/dL (ref 65–179)
Glucose, 2 hour: 125 mg/dL (ref 65–152)
Glucose, Fasting: 84 mg/dL (ref 65–91)

## 2020-06-11 LAB — CBC
Hematocrit: 29.7 % — ABNORMAL LOW (ref 34.0–46.6)
Hemoglobin: 9.9 g/dL — ABNORMAL LOW (ref 11.1–15.9)
MCH: 27.6 pg (ref 26.6–33.0)
MCHC: 33.3 g/dL (ref 31.5–35.7)
MCV: 83 fL (ref 79–97)
Platelets: 301 10*3/uL (ref 150–450)
RBC: 3.59 x10E6/uL — ABNORMAL LOW (ref 3.77–5.28)
RDW: 12.5 % (ref 11.7–15.4)
WBC: 9.6 10*3/uL (ref 3.4–10.8)

## 2020-06-11 LAB — RPR: RPR Ser Ql: NONREACTIVE

## 2020-06-11 LAB — HIV ANTIBODY (ROUTINE TESTING W REFLEX): HIV Screen 4th Generation wRfx: NONREACTIVE

## 2020-06-15 ENCOUNTER — Encounter: Payer: Self-pay | Admitting: *Deleted

## 2020-06-15 ENCOUNTER — Ambulatory Visit: Payer: Medicaid Other | Admitting: *Deleted

## 2020-06-15 ENCOUNTER — Other Ambulatory Visit: Payer: Self-pay

## 2020-06-15 ENCOUNTER — Ambulatory Visit: Payer: Medicaid Other | Attending: Obstetrics and Gynecology

## 2020-06-15 DIAGNOSIS — Z362 Encounter for other antenatal screening follow-up: Secondary | ICD-10-CM | POA: Diagnosis not present

## 2020-06-15 DIAGNOSIS — Z3400 Encounter for supervision of normal first pregnancy, unspecified trimester: Secondary | ICD-10-CM

## 2020-06-15 DIAGNOSIS — Z3A27 27 weeks gestation of pregnancy: Secondary | ICD-10-CM | POA: Diagnosis not present

## 2020-06-18 ENCOUNTER — Other Ambulatory Visit: Payer: Self-pay

## 2020-06-18 ENCOUNTER — Inpatient Hospital Stay (HOSPITAL_COMMUNITY)
Admission: AD | Admit: 2020-06-18 | Discharge: 2020-06-18 | Disposition: A | Discharge status: Home / Self Care | Payer: Medicaid Other | Attending: Obstetrics and Gynecology | Admitting: Obstetrics and Gynecology

## 2020-06-18 ENCOUNTER — Encounter (HOSPITAL_COMMUNITY): Payer: Self-pay | Admitting: Obstetrics and Gynecology

## 2020-06-18 DIAGNOSIS — O163 Unspecified maternal hypertension, third trimester: Secondary | ICD-10-CM

## 2020-06-18 DIAGNOSIS — O99353 Diseases of the nervous system complicating pregnancy, third trimester: Secondary | ICD-10-CM | POA: Insufficient documentation

## 2020-06-18 DIAGNOSIS — Z3A28 28 weeks gestation of pregnancy: Secondary | ICD-10-CM | POA: Insufficient documentation

## 2020-06-18 DIAGNOSIS — O26893 Other specified pregnancy related conditions, third trimester: Secondary | ICD-10-CM | POA: Diagnosis present

## 2020-06-18 DIAGNOSIS — O99891 Other specified diseases and conditions complicating pregnancy: Secondary | ICD-10-CM | POA: Diagnosis not present

## 2020-06-18 DIAGNOSIS — R030 Elevated blood-pressure reading, without diagnosis of hypertension: Secondary | ICD-10-CM | POA: Insufficient documentation

## 2020-06-18 DIAGNOSIS — G44209 Tension-type headache, unspecified, not intractable: Secondary | ICD-10-CM

## 2020-06-18 DIAGNOSIS — Z3689 Encounter for other specified antenatal screening: Secondary | ICD-10-CM

## 2020-06-18 DIAGNOSIS — Z3400 Encounter for supervision of normal first pregnancy, unspecified trimester: Secondary | ICD-10-CM

## 2020-06-18 LAB — COMPREHENSIVE METABOLIC PANEL
ALT: 18 U/L (ref 0–44)
AST: 24 U/L (ref 15–41)
Albumin: 2.8 g/dL — ABNORMAL LOW (ref 3.5–5.0)
Alkaline Phosphatase: 78 U/L (ref 38–126)
Anion gap: 11 (ref 5–15)
BUN: 7 mg/dL (ref 6–20)
CO2: 18 mmol/L — ABNORMAL LOW (ref 22–32)
Calcium: 8.8 mg/dL — ABNORMAL LOW (ref 8.9–10.3)
Chloride: 107 mmol/L (ref 98–111)
Creatinine, Ser: 0.5 mg/dL (ref 0.44–1.00)
GFR, Estimated: >60 mL/min (ref >60)
Glucose, Bld: 87 mg/dL (ref 70–99)
Potassium: 3.4 mmol/L — ABNORMAL LOW (ref 3.5–5.1)
Sodium: 136 mmol/L (ref 135–145)
Total Bilirubin: 0.5 mg/dL (ref 0.3–1.2)
Total Protein: 6.1 g/dL — ABNORMAL LOW (ref 6.5–8.1)

## 2020-06-18 LAB — CBC WITH DIFFERENTIAL/PLATELET
Abs Immature Granulocytes: 0.1 10*3/uL — ABNORMAL HIGH (ref 0.00–0.07)
Basophils Absolute: 0 10*3/uL (ref 0.0–0.1)
Basophils Relative: 0 %
Eosinophils Absolute: 0.2 10*3/uL (ref 0.0–0.5)
Eosinophils Relative: 2 %
HCT: 29.7 % — ABNORMAL LOW (ref 36.0–46.0)
Hemoglobin: 9.7 g/dL — ABNORMAL LOW (ref 12.0–15.0)
Immature Granulocytes: 1 %
Lymphocytes Relative: 15 %
Lymphs Abs: 1.8 10*3/uL (ref 0.7–4.0)
MCH: 27.2 pg (ref 26.0–34.0)
MCHC: 32.7 g/dL (ref 30.0–36.0)
MCV: 83.2 fL (ref 80.0–100.0)
Monocytes Absolute: 0.9 10*3/uL (ref 0.1–1.0)
Monocytes Relative: 7 %
Neutro Abs: 8.8 10*3/uL — ABNORMAL HIGH (ref 1.7–7.7)
Neutrophils Relative %: 75 %
Platelets: 322 10*3/uL (ref 150–400)
RBC: 3.57 MIL/uL — ABNORMAL LOW (ref 3.87–5.11)
RDW: 13.5 % (ref 11.5–15.5)
WBC: 11.9 10*3/uL — ABNORMAL HIGH (ref 4.0–10.5)
nRBC: 0 % (ref 0.0–0.2)

## 2020-06-18 LAB — URINALYSIS, ROUTINE W REFLEX MICROSCOPIC
Bilirubin Urine: NEGATIVE
Glucose, UA: NEGATIVE mg/dL
Hgb urine dipstick: NEGATIVE
Ketones, ur: NEGATIVE mg/dL
Leukocytes,Ua: NEGATIVE
Nitrite: NEGATIVE
Protein, ur: NEGATIVE mg/dL
Specific Gravity, Urine: 1.003 — ABNORMAL LOW (ref 1.005–1.030)
pH: 6 (ref 5.0–8.0)

## 2020-06-18 LAB — PROTEIN / CREATININE RATIO, URINE
Creatinine, Urine: 23.24 mg/dL
Total Protein, Urine: <6 mg/dL

## 2020-06-18 MED ORDER — ACETAMINOPHEN 500 MG PO TABS
1000.0000 mg | ORAL_TABLET | Freq: Once | ORAL | Status: AC
  Administered 2020-06-18: 1000 mg via ORAL
  Filled 2020-06-18: qty 2

## 2020-06-18 NOTE — MAU Provider Note (Addendum)
Chief Complaint:  Hypertension   Event Date/Time   First Provider Initiated Contact with Patient 06/18/20 1259     HPI: Brandy Burgess is a 20 y.o. G1P0 at [redacted]w[redacted]d who presents to maternity admissions reporting headache and elevated blood pressure readings. She states on 06/08/20 she developed symptoms of cough, sneezing, difficulty breathing and headaches. These symptoms continued until 06/13/20 and on 06/14/20 she was tested negative for COVID. She is no longer having these symptoms and currently denies cough, rhinorrhea, sneezing, fever and difficulty breathing. Pt stated she did not take any cold medications for her symptoms. She states she first noted an elevated blood pressure reading on 12/31, these have continued daily since and have been in upper 557D systolic. She says this morning her headache started on the right side of forehead and is pulsating. Rated 7/10 on the pain scale. She does have occasional pulsating felt on the back of her head. She has not tried any medications to relieve the pain. Denies previous history of migraines or hypertension. She denies visual changes, diplopia, floaters, epigastric pain and back pain. Endorses normal fetal movement and denies contractions, cramping, or vaginal discharge/bleeding.  Location: Right side of forehead Quality: Throbbing Severity: 7/10 in pain scale Duration: Constant Timing: Started this morning Modifying factors: none Associated signs and symptoms: none  Pregnancy Course:   Past Medical History:  Diagnosis Date  . Medical history non-contributory    OB History  Gravida Para Term Preterm AB Living  1            SAB IAB Ectopic Multiple Live Births               # Outcome Date GA Lbr Len/2nd Weight Sex Delivery Anes PTL Lv  1 Current            Past Surgical History:  Procedure Laterality Date  . NO PAST SURGERIES     Family History  Problem Relation Age of Onset  . Asthma Mother   . Diabetes Maternal Aunt   . Diabetes  Maternal Grandmother    Social History   Tobacco Use  . Smoking status: Never Smoker  . Smokeless tobacco: Never Used  Vaping Use  . Vaping Use: Never used  Substance Use Topics  . Alcohol use: Never  . Drug use: Never   No Known Allergies No medications prior to admission.    I have reviewed patient's Past Medical Hx, Surgical Hx, Family Hx, Social Hx, medications and allergies.   ROS:  Review of Systems  Constitutional: Negative for fatigue and fever.  HENT: Negative for congestion, rhinorrhea and sore throat.   Eyes: Negative for photophobia and visual disturbance.  Respiratory: Negative for cough and shortness of breath.   Cardiovascular: Negative for chest pain.  Gastrointestinal: Negative for abdominal pain, nausea and vomiting.  Genitourinary: Negative for vaginal bleeding and vaginal discharge.  Neurological: Positive for headaches. Negative for dizziness, syncope and light-headedness.  All other systems reviewed and are negative.   Physical Exam   Patient Vitals for the past 24 hrs:  BP Pulse SpO2 Height  06/18/20 1455 -- -- 100 % --  06/18/20 1450 -- -- 99 % --  06/18/20 1446 131/78 97 -- --  06/18/20 1445 -- -- 100 % --  06/18/20 1440 -- -- 99 % --  06/18/20 1435 -- -- 100 % --  06/18/20 1431 132/76 97 -- --  06/18/20 1430 -- -- 99 % --  06/18/20 1425 -- -- 99 % --  06/18/20 1420 -- -- 99 % --  06/18/20 1416 131/78 93 -- --  06/18/20 1415 -- -- 99 % --  06/18/20 1410 -- -- 99 % --  06/18/20 1405 -- -- 99 % --  06/18/20 1401 128/77 93 -- --  06/18/20 1400 -- -- 99 % --  06/18/20 1355 -- -- 99 % --  06/18/20 1350 -- -- 100 % --  06/18/20 1346 132/81 90 -- --  06/18/20 1345 -- -- 99 % --  06/18/20 1340 -- -- 99 % --  06/18/20 1335 -- -- 99 % --  06/18/20 1331 133/81 93 -- --  06/18/20 1330 -- -- 99 % --  06/18/20 1325 -- -- 99 % --  06/18/20 1320 -- -- 98 % --  06/18/20 1316 132/72 100 -- --  06/18/20 1315 -- -- 99 % --  06/18/20 1310 -- -- 99 %  --  06/18/20 1305 -- -- 99 % --  06/18/20 1301 134/76 99 -- --  06/18/20 1300 -- -- 99 % --  06/18/20 1255 -- -- 99 % --  06/18/20 1226 (!) 147/82 (!) 110 -- --  06/18/20 1225 (!) 147/82 (!) 110 -- $Remo'5\' 5"'XiWTS$  (1.651 m)    Constitutional: Well-developed, well-nourished female in no acute distress.  Cardiovascular: normal rate & rhythm, no murmur Respiratory: normal effort, lung sounds clear throughout GI: Abd soft, non-tender, gravid appropriate for gestational age. Pos BS x 4 MS: Extremities nontender, no edema, normal ROM Neurologic: Alert and oriented x 4.  GU: no CVA tenderness Pelvic exam deferred    Fetal Tracing: reactive Baseline: 150 Variability: moderate Accelerations: present Decelerations: none Toco: relaxed   Labs: Results for orders placed or performed during the hospital encounter of 06/18/20 (from the past 24 hour(s))  Urinalysis, Routine w reflex microscopic Urine, Clean Catch     Status: Abnormal   Collection Time: 06/18/20 12:49 PM  Result Value Ref Range   Color, Urine COLORLESS (A) YELLOW   APPearance CLEAR CLEAR   Specific Gravity, Urine 1.003 (L) 1.005 - 1.030   pH 6.0 5.0 - 8.0   Glucose, UA NEGATIVE NEGATIVE mg/dL   Hgb urine dipstick NEGATIVE NEGATIVE   Bilirubin Urine NEGATIVE NEGATIVE   Ketones, ur NEGATIVE NEGATIVE mg/dL   Protein, ur NEGATIVE NEGATIVE mg/dL   Nitrite NEGATIVE NEGATIVE   Leukocytes,Ua NEGATIVE NEGATIVE  Protein / creatinine ratio, urine     Status: None   Collection Time: 06/18/20  1:12 PM  Result Value Ref Range   Creatinine, Urine 23.24 mg/dL   Total Protein, Urine <6 mg/dL   Protein Creatinine Ratio        0.00 - 0.15 mg/mg[Cre]  CBC with Differential/Platelet     Status: Abnormal   Collection Time: 06/18/20  1:24 PM  Result Value Ref Range   WBC 11.9 (H) 4.0 - 10.5 K/uL   RBC 3.57 (L) 3.87 - 5.11 MIL/uL   Hemoglobin 9.7 (L) 12.0 - 15.0 g/dL   HCT 29.7 (L) 36.0 - 46.0 %   MCV 83.2 80.0 - 100.0 fL   MCH 27.2 26.0 - 34.0  pg   MCHC 32.7 30.0 - 36.0 g/dL   RDW 13.5 11.5 - 15.5 %   Platelets 322 150 - 400 K/uL   nRBC 0.0 0.0 - 0.2 %   Neutrophils Relative % 75 %   Neutro Abs 8.8 (H) 1.7 - 7.7 K/uL   Lymphocytes Relative 15 %   Lymphs Abs 1.8 0.7 - 4.0 K/uL   Monocytes  Relative 7 %   Monocytes Absolute 0.9 0.1 - 1.0 K/uL   Eosinophils Relative 2 %   Eosinophils Absolute 0.2 0.0 - 0.5 K/uL   Basophils Relative 0 %   Basophils Absolute 0.0 0.0 - 0.1 K/uL   Immature Granulocytes 1 %   Abs Immature Granulocytes 0.10 (H) 0.00 - 0.07 K/uL  Comprehensive metabolic panel     Status: Abnormal   Collection Time: 06/18/20  1:24 PM  Result Value Ref Range   Sodium 136 135 - 145 mmol/L   Potassium 3.4 (L) 3.5 - 5.1 mmol/L   Chloride 107 98 - 111 mmol/L   CO2 18 (L) 22 - 32 mmol/L   Glucose, Bld 87 70 - 99 mg/dL   BUN 7 6 - 20 mg/dL   Creatinine, Ser 0.50 0.44 - 1.00 mg/dL   Calcium 8.8 (L) 8.9 - 10.3 mg/dL   Total Protein 6.1 (L) 6.5 - 8.1 g/dL   Albumin 2.8 (L) 3.5 - 5.0 g/dL   AST 24 15 - 41 U/L   ALT 18 0 - 44 U/L   Alkaline Phosphatase 78 38 - 126 U/L   Total Bilirubin 0.5 0.3 - 1.2 mg/dL   GFR, Estimated >60 >60 mL/min   Anion gap 11 5 - 15    Imaging:  No results found.  MAU Course: Orders Placed This Encounter  Procedures  . Urinalysis, Routine w reflex microscopic Urine, Clean Catch  . CBC with Differential/Platelet  . Comprehensive metabolic panel  . Protein / creatinine ratio, urine  . Discharge patient   Meds ordered this encounter  Medications  . acetaminophen (TYLENOL) tablet 1,000 mg   MDM: HA treated with $RemoveBefo'1000mg'WZOvobsUPCR$  of Tylenol with relief acceptable to patient. Brought HA down to 4/10, pt declined additional treatment.  Preeclampsia labs normal  Assessment: 1. Elevated blood pressure affecting pregnancy in third trimester, antepartum   2. Supervision of normal first pregnancy, antepartum   3. Acute non intractable tension-type headache   4. NST (non-stress test) reactive     Plan: Discharge home in stable condition with preeclampsia precautions  Message sent to [facility] [place], pt's next visit changed to in-person for BP check and FHT.   Follow-up Information    [facility] Follow up on 06/24/2020.   Specialty: Obstetrics and Gynecology Why: as scheduled for ongoing prenatal care, office will switch the appointment to in-person instead of virtual Contact information: [place] [phone]             Allergies as of 06/18/2020   No Known Allergies     Medication List    TAKE these medications   aspirin EC 81 MG tablet Take 1 tablet (81 mg total) by mouth daily.   Blood Pressure Kit Devi Please check blood pressure 1-2 per week.   famotidine 40 MG tablet Commonly known as: Pepcid Take 1 tablet (40 mg total) by mouth daily.   Gojji Weight Scale Misc 1 Device by Does not apply route daily as needed. To weight self daily as needed at home. ICD-10 code: O09.90   PrePLUS 27-1 MG Tabs Take 1 tablet by mouth daily.      Marliss Coots, PA-S [facility] of Supervision of Student:  I confirm that I have verified the information documented in the physician assistant student's note and that I have also personally performed the history, physical exam and all medical decision making activities.  I have verified that  all services and findings are accurately documented in this student's note; and I agree with management and plan as outlined in the documentation. I have also made any necessary editorial changes.  Gabriel Carina, [place] for [employer] 06/18/2020 4:12 PM

## 2020-06-18 NOTE — Telephone Encounter (Signed)
Called patient regarding blood pressures. Babyscripts called clinic to report high readings. Patient stated she is having a headache today. Advised patient to go to MAU for further evaluation. Please see MyChart message from 06/15/20.  Spoke with Joni Reining, NP at [facility] regarding patient.       Clovis Pu, RN

## 2020-06-18 NOTE — MAU Note (Signed)
Pt reports She had elevated B/P since 12/31. Told to take b/p everyday at home. Told to come in today by office. C/o mild headache no visual changes. Good fetal movement felt.

## 2020-06-18 NOTE — Discharge Instructions (Signed)

## 2020-06-24 ENCOUNTER — Ambulatory Visit (INDEPENDENT_AMBULATORY_CARE_PROVIDER_SITE_OTHER): Payer: Medicaid Other

## 2020-06-24 ENCOUNTER — Other Ambulatory Visit: Payer: Self-pay

## 2020-06-24 VITALS — BP 124/76 | HR 87 | Wt 194.8 lb

## 2020-06-24 DIAGNOSIS — Z3A29 29 weeks gestation of pregnancy: Secondary | ICD-10-CM

## 2020-06-24 DIAGNOSIS — Y93E6 Activity, residential relocation: Secondary | ICD-10-CM

## 2020-06-24 DIAGNOSIS — Z3400 Encounter for supervision of normal first pregnancy, unspecified trimester: Secondary | ICD-10-CM

## 2020-06-24 NOTE — Patient Instructions (Signed)

## 2020-06-24 NOTE — Progress Notes (Signed)
   LOW-RISK PREGNANCY OFFICE VISIT  Patient name: Brandy Burgess MRN [id number]  Date of birth: 2000/06/17 Chief Complaint:   Routine Prenatal Visit  Subjective:   Brandy Burgess is a 20 y.o. G1P0 female at [redacted]w[redacted]d with an Estimated Date of Delivery: 09/07/20 being seen today for ongoing management of a Low-risk pregnancy aeb has Supervision of normal first pregnancy, antepartum on their problem list.  Patient presents today without complaint. Patient endorses fetal movement or denies vaginal concerns including abnormal discharge, leaking of fluid, and bleeding. She reports some cramping the past 2 days that resolved with increased hydration.  She denies abdominal contractions. Patient reports that she plans to move back to [place] in one month.  Contractions: Not present. Vag. Bleeding: None.  Movement: Present.  Reviewed past medical,surgical, social, obstetrical and family history as well as problem list, medications and allergies.  Objective   Vitals:   06/24/20 0934  BP: 124/76  Pulse: 87  Weight: 194 lb 12.8 oz (88.4 kg)  Body mass index is 32.42 kg/m.  Total Weight Gain:34 lb 12.8 oz (15.8 kg)         Physical Examination:   General appearance: Well appearing, and in no distress  Mental status: Alert, oriented to person, place, and time  Skin: Warm & dry  Cardiovascular: Normal heart rate noted  Respiratory: Normal respiratory effort, no distress  Abdomen: Soft, gravid, nontender, AGA with Fundal height of Fundal Height: 30 cm  Pelvic: Cervical exam deferred           Extremities: Edema: None  Fetal Status: Fetal Heart Rate (bpm): 150  Movement: Present   No results found for this or any previous visit (from the past 24 hour(s)).  Assessment & Plan:  Low-risk pregnancy of a 20 y.o., G1P0 at [redacted]w[redacted]d with an Estimated Date of Delivery: 09/07/20   1. Supervision of normal first pregnancy, antepartum -Anticipatory guidance for upcoming appts. -Reviewed 28 week  labs -Plan for next visit in 2 weeks-virtually  2. [redacted] weeks gestation of pregnancy -Doing well. -Reviewed contractions vs cramping and how to distinguish. -Encouraged proper hydration throughout the day.  3. Residential relocation -Planning move to [place]. -Instructed to start to look for new OB provider for transfer of care. -Also discussed finding pediatrician for infant care.      Meds: No orders of the defined types were placed in this encounter.  Labs/procedures today:  Lab Orders  No laboratory test(s) ordered today     Reviewed: Preterm labor symptoms and general obstetric precautions including but not limited to vaginal bleeding, contractions, leaking of fluid and fetal movement were reviewed in detail with the patient.  All questions were answered.  Follow-up: Return in about 2 weeks (around 07/08/2020) for Virtual LR-ROB.  No orders of the defined types were placed in this encounter.  Cherre Robins MSN, CNM 06/24/2020

## 2020-07-06 ENCOUNTER — Encounter: Payer: Self-pay | Admitting: *Deleted

## 2020-07-08 ENCOUNTER — Telehealth: Payer: Medicaid Other

## 2020-07-08 NOTE — Progress Notes (Signed)
BabyScripts notification BP one hr ago 160/80. Phone call to patient repeat BP 140/90. She has not symptoms. Advised to call the office in the morning to report and arrange BP check tomorrow or Friday, she understood and agreed  Adam Phenix, MD

## 2020-07-09 ENCOUNTER — Telehealth: Payer: Self-pay | Admitting: *Deleted

## 2020-07-09 NOTE — Telephone Encounter (Signed)
Received triage message from after hours line regarding patient's blood pressure reading from [facility]ys[employer]s.  Patient reported blood pressures 160/80, 150/? And 137/?. She reported palpations last night and reported to MD (Dr. Debroah Loop) on call. Patient stated that MD wanted to have a in-person office visit instead of Mychart. Patient next office visit is scheduled for 07/17/20 in person visit. Advised patient to bring home blood pressure monitor to appointment.  Clovis Pu, RN

## 2020-07-13 ENCOUNTER — Telehealth: Payer: Self-pay | Admitting: *Deleted

## 2020-07-13 NOTE — Telephone Encounter (Cosign Needed)
Phone call to patient regarding elevated blood pressure reading from 07/11/20. Babyscripts called clinic this AM reporting BP 144/69 with symptoms of nausea/vomiting, headache and shortness of breath.  Patient confirmed she was having those symptoms along with swollen feet, hard to walk and weakness. Patient stated she slept most of the time. Did not take any medications to help with symptoms due to the roads being bad in her area. Patient stated she is feeling a lot better today. Has not take blood pressure today. Advised patient to take blood pressure and call clinic or send mychart message with the reading. Advised if reading is elevated nurse will decide to send patient to MAU.     Will send message to provider for review.   Clovis Pu, RN

## 2020-07-14 NOTE — Telephone Encounter (Signed)
Received call from [employer] this morning regarding patient's blood pressure reading. Will send patient a message to see if she can be seen today for Blood pressure check in office.   Clovis Pu, RN

## 2020-07-17 ENCOUNTER — Ambulatory Visit (INDEPENDENT_AMBULATORY_CARE_PROVIDER_SITE_OTHER): Payer: Medicaid Other

## 2020-07-17 ENCOUNTER — Other Ambulatory Visit: Payer: Self-pay

## 2020-07-17 VITALS — BP 129/72 | HR 89 | Temp 98.5°F | Wt 196.6 lb

## 2020-07-17 DIAGNOSIS — O99013 Anemia complicating pregnancy, third trimester: Secondary | ICD-10-CM

## 2020-07-17 DIAGNOSIS — Z3400 Encounter for supervision of normal first pregnancy, unspecified trimester: Secondary | ICD-10-CM

## 2020-07-17 DIAGNOSIS — Z3A32 32 weeks gestation of pregnancy: Secondary | ICD-10-CM

## 2020-07-17 MED ORDER — FERROUS SULFATE 325 (65 FE) MG PO TBEC: 325.0000 mg | DELAYED_RELEASE_TABLET | ORAL | 1 refills | Status: AC

## 2020-07-17 NOTE — Patient Instructions (Addendum)
Third Trimester of Pregnancy  The third trimester of pregnancy is from week 28 through week 40. This is months 7 through 9. The third trimester is a time when the unborn baby (fetus) is growing rapidly. At the end of the ninth month, the fetus is about 20 inches long and weighs 6-10 pounds. Body changes during your third trimester During the third trimester, your body will continue to go through many changes. The changes vary and generally return to normal after your baby is born. Physical changes  Your weight will continue to increase. You can expect to gain 25-35 pounds (11-16 kg) by the end of the pregnancy if you begin pregnancy at a normal weight. If you are underweight, you can expect to gain 28-40 lb (about 13-18 kg), and if you are overweight, you can expect to gain 15-25 lb (about 7-11 kg).  You may begin to get stretch marks on your hips, abdomen, and breasts.  Your breasts will continue to grow and may hurt. A yellow fluid (colostrum) may leak from your breasts. This is the first milk you are producing for your baby.  You may have changes in your hair. These can include thickening of your hair, rapid growth, and changes in texture. Some people also have hair loss during or after pregnancy, or hair that feels dry or thin.  Your belly button may stick out.  You may notice more swelling in your hands, face, or ankles. Health changes  You may have heartburn.  You may have constipation.  You may develop hemorrhoids.  You may develop swollen, bulging veins (varicose veins) in your legs.  You may have increased body aches in the pelvis, back, or thighs. This is due to weight gain and increased hormones that are relaxing your joints.  You may have increased tingling or numbness in your hands, arms, and legs. The skin on your abdomen may also feel numb.  You may feel short of breath because of your expanding uterus. Other changes  You may urinate more often because the fetus is  moving lower into your pelvis and pressing on your bladder.  You may have more problems sleeping. This may be caused by the size of your abdomen, an increased need to urinate, and an increase in your body's metabolism.  You may notice the fetus "dropping," or moving lower in your abdomen (lightening).  You may have increased vaginal discharge.  You may notice that you have pain around your pelvic bone as your uterus distends. Follow these instructions at home: Medicines  Follow your health care provider's instructions regarding medicine use. Specific medicines may be either safe or unsafe to take during pregnancy. Do not take any medicines unless approved by your health care provider.  Take a prenatal vitamin that contains at least 600 micrograms (mcg) of folic acid. Eating and drinking  Eat a healthy diet that includes fresh fruits and vegetables, whole grains, good sources of protein such as meat, eggs, or tofu, and low-fat dairy products.  Avoid raw meat and unpasteurized juice, milk, and cheese. These carry germs that can harm you and your baby.  Eat 4 or 5 small meals rather than 3 large meals a day.  You may need to take these actions to prevent or treat constipation: ? Drink enough fluid to keep your urine pale yellow. ? Eat foods that are high in fiber, such as beans, whole grains, and fresh fruits and vegetables. ? Limit foods that are high in fat and processed sugars,  such as fried or sweet foods. Activity  Exercise only as directed by your health care provider. Most people can continue their usual exercise routine during pregnancy. Try to exercise for 30 minutes at least 5 days a week. Stop exercising if you experience contractions in the uterus.  Stop exercising if you develop pain or cramping in the lower abdomen or lower back.  Avoid heavy lifting.  Do not exercise if it is very hot or humid or if you are at a high altitude.  If you choose to, you may continue to  have sex unless your health care provider tells you not to. Relieving pain and discomfort  Take frequent breaks and rest with your legs raised (elevated) if you have leg cramps or low back pain.  Take warm sitz baths to soothe any pain or discomfort caused by hemorrhoids. Use hemorrhoid cream if your health care provider approves.  Wear a supportive bra to prevent discomfort from breast tenderness.  If you develop varicose veins: ? Wear support hose as told by your health care provider. ? Elevate your feet for 15 minutes, 3-4 times a day. ? Limit salt in your diet. Safety  Talk to your health care provider before traveling far distances.  Do not use hot tubs, steam rooms, or saunas.  Wear your seat belt at all times when driving or riding in a car.  Talk with your health care provider if someone is verbally or physically abusive to you. Preparing for birth To prepare for the arrival of your baby:  Take prenatal classes to understand, practice, and ask questions about labor and delivery.  Visit the hospital and tour the maternity area.  Purchase a rear-facing car seat and make sure you know how to install it in your car.  Prepare the baby's room or sleeping area. Make sure to remove all pillows and stuffed animals from the baby's crib to prevent suffocation. General instructions  Avoid cat litter boxes and soil used by cats. These carry germs that can cause birth defects in the baby. If you have a cat, ask someone to clean the litter box for you.  Do not douche or use tampons. Do not use scented sanitary pads.  Do not use any products that contain nicotine or tobacco, such as cigarettes, e-cigarettes, and chewing tobacco. If you need help quitting, ask your health care provider.  Do not use any herbal remedies, illegal drugs, or medicines that were not prescribed to you. Chemicals in these products can harm your baby.  Do not drink alcohol.  You will have more frequent  prenatal exams during the third trimester. During a routine prenatal visit, your health care provider will do a physical exam, perform tests, and discuss your overall health. Keep all follow-up visits. This is important. Where to find more information  American Pregnancy Association: americanpregnancy.org  American College of Obstetricians and Gynecologists: acog.org/en/Womens%20Health/Pregnancy  Office on Women's Health: womenshealth.gov/pregnancy Contact a health care provider if you have:  A fever.  Mild pelvic cramps, pelvic pressure, or nagging pain in your abdominal area or lower back.  Vomiting or diarrhea.  Bad-smelling vaginal discharge or foul-smelling urine.  Pain when you urinate.  A headache that does not go away when you take medicine.  Visual changes or see spots in front of your eyes. Get help right away if:  Your water breaks.  You have regular contractions less than 5 minutes apart.  You have spotting or bleeding from your vagina.  You have severe   abdominal pain.  You have difficulty breathing.  You have chest pain.  You have fainting spells.  You have not felt your baby move for the time period told by your health care provider.  You have new or increased pain, swelling, or redness in an arm or leg. Summary  The third trimester of pregnancy is from week 28 through week 40 (months 7 through 9).  You may have more problems sleeping. This can be caused by the size of your abdomen, an increased need to urinate, and an increase in your body's metabolism.  You will have more frequent prenatal exams during the third trimester. Keep all follow-up visits. This is important. This information is not intended to replace advice given to you by your health care provider. Make sure you discuss any questions you have with your health care provider. Document Revised: 11/06/2019 Document Reviewed: 09/12/2019 Elsevier Patient Education  2021 [employer]  Iron-Rich Diet  Iron is a mineral that helps your body to produce hemoglobin. Hemoglobin is a protein in red blood cells that carries oxygen to your body's tissues. Eating too little iron may cause you to feel weak and tired, and it can increase your risk of infection. Iron is naturally found in many foods, and many foods have iron added to them (iron-fortified foods). You may need to follow an iron-rich diet if you do not have enough iron in your body due to certain medical conditions. The amount of iron that you need each day depends on your age, your sex, and any medical conditions you have. Follow instructions from your health care provider or a diet and nutrition specialist (dietitian) about how much iron you should eat each day. What are tips for following this plan? Reading food labels  Check food labels to see how many milligrams (mg) of iron are in each serving. Cooking  Cook foods in pots and pans that are made from iron.  Take these steps to make it easier for your body to absorb iron from certain foods: ? Soak beans overnight before cooking. ? Soak whole grains overnight and drain them before using. ? Ferment flours before baking, such as by using yeast in bread dough. Meal planning  When you eat foods that contain iron, you should eat them with foods that are high in vitamin C. These include oranges, peppers, tomatoes, potatoes, and mango. Vitamin C helps your body to absorb iron. General information  Take iron supplements only as told by your health care provider. An overdose of iron can be life-threatening. If you were prescribed iron supplements, take them with orange juice or a vitamin C supplement.  When you eat iron-fortified foods or take an iron supplement, you should also eat foods that naturally contain iron, such as meat, poultry, and fish. Eating naturally iron-rich foods helps your body to absorb the iron that is added to other foods or contained in a  supplement.  Certain foods and drinks prevent your body from absorbing iron properly. Avoid eating these foods in the same meal as iron-rich foods or with iron supplements. These foods include: ? Coffee, black tea, and red wine. ? Milk, dairy products, and foods that are high in calcium. ? Beans and soybeans. ? Whole grains. What foods should I eat? Fruits Prunes. Raisins. Eat fruits high in vitamin C, such as oranges, grapefruits, and strawberries, alongside iron-rich foods. Vegetables Spinach (cooked). Green peas. Broccoli. Fermented vegetables. Eat vegetables high in vitamin C, such as leafy greens, potatoes, bell  peppers, and tomatoes, alongside iron-rich foods. Grains Iron-fortified breakfast cereal. Iron-fortified whole-wheat bread. Enriched rice. Sprouted grains. Meats and other proteins Beef liver. Oysters. Beef. Shrimp. [place]. Chicken. Tuna. Sardines. Chickpeas. Nuts. Tofu. Pumpkin seeds. Beverages Tomato juice. Fresh orange juice. Prune juice. Hibiscus tea. Fortified instant breakfast shakes. Sweets and desserts Blackstrap molasses. Seasonings and condiments Tahini. Fermented soy sauce. Other foods Wheat germ. The items listed above may not be a complete list of recommended foods and beverages. Contact a dietitian for more information. What foods should I avoid? Grains Whole grains. Bran cereal. Bran flour. Oats. Meats and other proteins Soybeans. Products made from soy protein. Black beans. Lentils. Mung beans. Split peas. Dairy Milk. Cream. Cheese. Yogurt. Cottage cheese. Beverages Coffee. Black tea. Red wine. Sweets and desserts Cocoa. Chocolate. Ice cream. Other foods Basil. Oregano. Large amounts of parsley. The items listed above may not be a complete list of foods and beverages to avoid. Contact a dietitian for more information. Summary  Iron is a mineral that helps your body to produce hemoglobin. Hemoglobin is a protein in red blood cells that carries  oxygen to your body's tissues.  Iron is naturally found in many foods, and many foods have iron added to them (iron-fortified foods).  When you eat foods that contain iron, you should eat them with foods that are high in vitamin C. Vitamin C helps your body to absorb iron.  Certain foods and drinks prevent your body from absorbing iron properly, such as whole grains and dairy products. You should avoid eating these foods in the same meal as iron-rich foods or with iron supplements. This information is not intended to replace advice given to you by your health care provider. Make sure you discuss any questions you have with your health care provider. Document Revised: 05/12/2017 Document Reviewed: 04/25/2017 Elsevier Patient Education  2021 [employer].

## 2020-07-17 NOTE — Progress Notes (Signed)
   LOW-RISK PREGNANCY OFFICE VISIT  Patient name: Brandy Burgess MRN [id number]  Date of birth: [birth year] Chief Complaint:   Routine Prenatal Visit  Subjective:   Brandy Burgess is a 20 y.o. G1P0 female at [redacted]w[redacted]d with an Estimated Date of Delivery: 09/07/20 being seen today for ongoing management of a low-risk pregnancy aeb has Supervision of normal first pregnancy, antepartum on their problem list.  Patient presents today without complaint today.  Patient endorses fetal movement. Patient denies abdominal cramping and contractions.  Patient denies vaginal concerns including abnormal discharge, leaking of fluid, and bleeding. She questions if she can continue working.  She also reports she will be moving in 2 weeks. Contractions: Not present. Vag. Bleeding: None.  Movement: Present.  Reviewed past medical,surgical, social, obstetrical and family history as well as problem list, medications and allergies.  Objective   Vitals:   07/17/20 0846 07/17/20 0847  BP: 136/80 129/72  Pulse: 99 89  Temp: 98.5 F (36.9 C)   Weight: 196 lb 9.6 oz (89.2 kg)   Body mass index is 32.72 kg/m.  Total Weight Gain:36 lb 9.6 oz (16.6 kg)         Physical Examination:   General appearance: Well appearing, and in no distress  Mental status: Alert, oriented to person, place, and time  Skin: Warm & dry  Cardiovascular: Normal heart rate noted  Respiratory: Normal respiratory effort, no distress  Abdomen: Soft, gravid, nontender, AGA with Fundal height of Fundal Height: 33 cm  Pelvic: Cervical exam deferred           Extremities: Edema: None  Fetal Status: Fetal Heart Rate (bpm): 148  Movement: Present   No results found for this or any previous visit (from the past 24 hour(s)).  Assessment & Plan:  LOW-risk pregnancy of a 20 y.o., G1P0 at [redacted]w[redacted]d with an Estimated Date of Delivery: 09/07/20   1. Supervision of normal first pregnancy, antepartum -Anticipatory guidance for upcoming appts. -Patient  to next appt in 2 weeks for an in-person visit. -Encouraged to continue working as long as she can tolerate.    2. [redacted] weeks gestation of pregnancy -Doing well -Plans to move to [place] in 2 weeks. 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 -Instructed to schedule next appt just in case. -Patient agreeable.    3. Anemia in pregnancy, third trimester -Review of labs shows HgB of 9.7 on 1/6. -Started on iron supplement for QOD usage. -Discussed using with high vitamin C source.    Meds:  Meds ordered this encounter  Medications  . ferrous sulfate 325 (65 FE) MG EC tablet    Sig: Take 1 tablet (325 mg total) by mouth every other day.    Dispense:  45 tablet    Refill:  1    Order Specific Question:   Supervising Provider    Answer:   Reva Bores [[id number]]   Labs/procedures today:  Lab Orders  No laboratory test(s) ordered today     Reviewed: Preterm labor symptoms and general obstetric precautions including but not limited to vaginal bleeding, contractions, leaking of fluid and fetal movement were reviewed in detail with the patient.  All questions were answered.  Follow-up: Return in about 2 weeks (around 07/31/2020) for LROB.  No orders of the defined types were placed in this encounter.  Cherre Robins MSN, CNM 07/17/2020

## 2020-07-27 ENCOUNTER — Other Ambulatory Visit: Payer: Self-pay

## 2020-07-27 ENCOUNTER — Inpatient Hospital Stay (HOSPITAL_COMMUNITY)
Admission: AD | Admit: 2020-07-27 | Discharge: 2020-07-27 | Disposition: A | Discharge status: Home / Self Care | Payer: Medicaid Other | Attending: Obstetrics and Gynecology | Admitting: Obstetrics and Gynecology

## 2020-07-27 ENCOUNTER — Encounter (HOSPITAL_COMMUNITY): Payer: Self-pay | Admitting: Obstetrics and Gynecology

## 2020-07-27 DIAGNOSIS — B373 Candidiasis of vulva and vagina: Secondary | ICD-10-CM

## 2020-07-27 DIAGNOSIS — O26893 Other specified pregnancy related conditions, third trimester: Secondary | ICD-10-CM | POA: Insufficient documentation

## 2020-07-27 DIAGNOSIS — Z79899 Other long term (current) drug therapy: Secondary | ICD-10-CM | POA: Insufficient documentation

## 2020-07-27 DIAGNOSIS — O98313 Other infections with a predominantly sexual mode of transmission complicating pregnancy, third trimester: Secondary | ICD-10-CM | POA: Insufficient documentation

## 2020-07-27 DIAGNOSIS — Z7982 Long term (current) use of aspirin: Secondary | ICD-10-CM | POA: Diagnosis not present

## 2020-07-27 DIAGNOSIS — R030 Elevated blood-pressure reading, without diagnosis of hypertension: Secondary | ICD-10-CM | POA: Insufficient documentation

## 2020-07-27 DIAGNOSIS — O98513 Other viral diseases complicating pregnancy, third trimester: Secondary | ICD-10-CM | POA: Diagnosis not present

## 2020-07-27 DIAGNOSIS — O98813 Other maternal infectious and parasitic diseases complicating pregnancy, third trimester: Secondary | ICD-10-CM | POA: Insufficient documentation

## 2020-07-27 DIAGNOSIS — A630 Anogenital (venereal) warts: Secondary | ICD-10-CM | POA: Insufficient documentation

## 2020-07-27 DIAGNOSIS — Z3A34 34 weeks gestation of pregnancy: Secondary | ICD-10-CM | POA: Diagnosis not present

## 2020-07-27 DIAGNOSIS — B379 Candidiasis, unspecified: Secondary | ICD-10-CM

## 2020-07-27 LAB — URINALYSIS, ROUTINE W REFLEX MICROSCOPIC
Bilirubin Urine: NEGATIVE
Glucose, UA: NEGATIVE mg/dL
Hgb urine dipstick: NEGATIVE
Ketones, ur: NEGATIVE mg/dL
Leukocytes,Ua: NEGATIVE
Nitrite: NEGATIVE
Protein, ur: NEGATIVE mg/dL
Specific Gravity, Urine: 1.006 (ref 1.005–1.030)
pH: 6 (ref 5.0–8.0)

## 2020-07-27 LAB — WET PREP, GENITAL
Clue Cells Wet Prep HPF POC: NONE SEEN
Sperm: NONE SEEN
Trich, Wet Prep: NONE SEEN

## 2020-07-27 MED ORDER — TERCONAZOLE 0.4 % VA CREA: 1.0000 | TOPICAL_CREAM | Freq: Every day | VAGINAL | 0 refills | Status: AC

## 2020-07-27 NOTE — Discharge Instructions (Signed)
Genital Warts  Genital warts are a common STI (sexually transmitted infection). They are small warts in the area around the genitals or the opening of the butt (anus). They sometimes cause pain. Genital warts are easily passed to other people through sex. Many people do not know that they are infected. They may be infected for years without symptoms. Even without symptoms, they can pass the infection to their sex partners. What are the causes? Genital warts are caused by a virus called HPV. HPV spreads from person to person during sex. It can be spread through vaginal, anal, and oral sex. What increases the risk?  Having sex without using a condom.  Having sex with many people.  Having sex before the age of 16.  Being a female who is not circumcised.  Having a female sex partner who is not circumcised.  Having a weak body defense system (immune system). What are the signs or symptoms?  Small warts in the area around the genitals or the opening of the butt. These warts often grow in clusters.  Itching and irritation in the genital area or the area around the opening of the butt.  Bleeding from the warts.  Pain during sex. How is this treated?  Medicines, such as creams, that are put on the skin.  Procedures, such as: ? Freezing the warts. ? Burning the warts. ? Having surgery to get rid of the warts. Getting treatment is important because genital warts can lead to other problems. In females, the virus that causes the warts may increase the risk for cancer of the cervix. Follow these instructions at home: Medicines  Apply over-the-counter and prescription medicines only as told by your doctor.  Do not use medicines that are meant for treating hand warts.  Talk with your doctor about using creams to treat itching.   Instructions for women  Get regular tests to check for cancer of the cervix. This type of cancer grows slowly and can almost always be cured if it is found  early.  If you become pregnant, tell your doctor that you have had genital warts. General instructions  Do not touch or scratch the warts.  Do not have sex until your treatment is done.  Tell your current and past sex partners about your condition. They may need treatment.  After treatment, use condoms during sex.  Keep all follow-up visits as told by your doctor. This is important. How is this prevented? Talk with your doctor about getting the HPV shot. The HPV shot:  Can help stop some HPV infections and cancers.  Is given to males and females who are 11-26 years old.  Is not recommended for pregnant women.  Will not work if you already have HPV. Contact a doctor if:  You have redness, swelling, or pain in the area of the treated skin.  You have a fever.  You feel sick.  You have lumps or have bleeding in the area around your genitals or the opening of your butt.  You have pain during sex.  You have bleeding after sex. Summary  Genital warts are a common STI. They are small warts in the area around the genitals or the opening of the butt (anus).  A virus called HPV causes genital warts.  The virus is spread by having sex without using a condom.  Getting treatment is important.  This condition is treated using medicines. Freezing, burning, or surgery may be done to get rid of the warts. This information   is not intended to replace advice given to you by your health care provider. Make sure you discuss any questions you have with your health care provider. Document Revised: 04/11/2019 Document Reviewed: 04/11/2019 Elsevier Patient Education  2021 Elsevier Inc.  

## 2020-07-27 NOTE — MAU Note (Signed)
Pt states she has "warts" on her perineum. Noticed about 2 weeks ago. Denies VB. Has thin white discharge. +FM.

## 2020-07-27 NOTE — MAU Provider Note (Addendum)
History     CSN: [id number]  Arrival date and time: 07/27/20 1227   Event Date/Time   First Provider Initiated Contact with Patient 07/27/20 1341      No chief complaint on file.  Ms. Clewis is a 20 y/o female patient, G1P0 [redacted]w[redacted]d. She presents today reporting 2 weeks of possible genital warts and itching. She states she first noticed one singular wart on her perineum, and since then she has noticed at least five more lesions on both her perineum and surrounding her anus. The patient reports that there has been associated itching, and that post itching the lesions have bled and scabbed over. She contacted her established OB/GYN as she had an upcoming appointment with them, and they recommended presentation to the MAU today. She states has experienced slight vaginal discharge for several weeks that she describes as thin and white, without associated odor. She states that she has experienced possible Braxton Hicks contractions while at work yesterday x4, the cramping was irregular, and it resolved with ambulation. She denies currently bleeding, discharge, or pain associated with the lesions. She denies dizziness, chest pain, SOB, N/V/D, vaginal bleeding, dysuria, or hematuria.     OB History    Gravida  1   Para      Term      Preterm      AB      Living        SAB      IAB      Ectopic      Multiple      Live Births                Past Medical History:  Diagnosis Date  . Medical history non-contributory     Past Surgical History:  Procedure Laterality Date  . NO PAST SURGERIES      Family History  Problem Relation Age of Onset  . Asthma Mother   . Diabetes Maternal Aunt   . Diabetes Maternal Grandmother     Social History   Tobacco Use  . Smoking status: Never Smoker  . Smokeless tobacco: Never Used  Vaping Use  . Vaping Use: Never used  Substance Use Topics  . Alcohol use: Never  . Drug use: Never    Allergies: No Known  Allergies  Medications Prior to Admission  Medication Sig Dispense Refill Last Dose  . aspirin EC 81 MG tablet Take 1 tablet (81 mg total) by mouth daily. 60 tablet 2 07/27/2020 at Unknown time  . famotidine (PEPCID) 40 MG tablet Take 1 tablet (40 mg total) by mouth daily. 30 tablet 11 07/27/2020 at Unknown time  . ferrous sulfate 325 (65 FE) MG EC tablet Take 1 tablet (325 mg total) by mouth every other day. 45 tablet 1 07/26/2020 at Unknown time  . Prenatal Vit-Fe Fumarate-FA (PREPLUS) 27-1 MG TABS Take 1 tablet by mouth daily. 30 tablet 13 07/26/2020 at Unknown time  . Blood Pressure Monitoring (BLOOD PRESSURE KIT) DEVI Please check blood pressure 1-2 per week. 1 each 0   . Misc. Devices (GOJJI WEIGHT SCALE) MISC 1 Device by Does not apply route daily as needed. To weight self daily as needed at home. ICD-10 code: O09.90 1 each 0     Review of Systems  Constitutional: Negative for appetite change, chills, fatigue and fever.  HENT: Negative for congestion, hearing loss, mouth sores and rhinorrhea.   Respiratory: Negative for cough, chest tightness and shortness of breath.   Cardiovascular: Positive for  leg swelling. Negative for chest pain and palpitations.  Gastrointestinal: Negative for abdominal pain, anal bleeding, blood in stool, constipation, diarrhea, nausea and vomiting.  Endocrine: Negative for cold intolerance and heat intolerance.  Genitourinary: Positive for vaginal discharge. Negative for difficulty urinating, dysuria, hematuria, pelvic pain, vaginal bleeding and vaginal pain.  Skin: Negative.   Neurological: Positive for headaches. Negative for dizziness and syncope.  Psychiatric/Behavioral: Negative for agitation.   Physical Exam   Blood pressure 130/79, pulse 99, temperature 98.4 F (36.9 C), temperature source Oral, resp. rate 18, height $RemoveBe'5\' 5"'LNjCzxmCa$  (1.651 m), weight 90.8 kg, last menstrual period 12/02/2019, SpO2 100 %.  Physical Exam Vitals and nursing note reviewed. Exam  conducted with a chaperone present.  Constitutional:      Appearance: Normal appearance. She is normal weight.  HENT:     Head: Normocephalic and atraumatic.     Mouth/Throat:     Mouth: Mucous membranes are moist.     Pharynx: Oropharynx is clear.  Eyes:     Extraocular Movements: Extraocular movements intact.     Pupils: Pupils are equal, round, and reactive to light.  Cardiovascular:     Rate and Rhythm: Normal rate and regular rhythm.     Pulses: Normal pulses.     Heart sounds: Normal heart sounds.  Pulmonary:     Effort: Pulmonary effort is normal.     Breath sounds: Normal breath sounds.  Abdominal:     General: Bowel sounds are normal.  Genitourinary:    General: Normal vulva.     Vagina: Vaginal discharge present.     Rectum: Normal.     Comments: Cluster of condylomas present on the perineum, singular condyloma on the clitoral hood. Thin white discharge noted. Musculoskeletal:     Comments: Swelling present to bilateral lower extremities   Skin:    General: Skin is warm and dry.     Capillary Refill: Capillary refill takes less than 2 seconds.  Neurological:     General: No focal deficit present.     Mental Status: She is alert and oriented to person, place, and time. Mental status is at baseline.  Psychiatric:        Mood and Affect: Mood normal.        Behavior: Behavior normal.        Thought Content: Thought content normal.      MAU Course  Procedures   MDM The patient presented for evaluation of genital condylomas vs herpes simplex rule out as referred by [facility]. NP Rasch, PA-S Shara Blazing, and RN chaperone at bedside for physical examination of the affected area. NP Rasch determined the lesions present were indeed condylomas, and provided the patient with reassurance that they should not affect her plan for vaginal birth. Specimens collected: urine specimen for UA, wet mount swab to r/o trichomonas/BV/candida, culture to r/o gonorrhea/chlamydia. The  patient remained in stable condition, and was able to be discharged home.   UA - NEGATIVE WET MOUNT - POSITIVE FOR YEAST  Assessment and Plan   1. 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 Vaginal Candida - treat with terazole x7d; insert into vagina at night 3. Elevated blood pressure affecting pregnancy in third trimester, antepartum   4. Supervision of normal first pregnancy, antepartum    Wendall Papa 07/27/2020, 1:41 PM    CNM attestation:  I have seen and examined this patient and agree with above documentation in the PA stuents note.   Kathryne Gin  Setzler is a 20 y.o. G1P0 female reporting genital condyloma. She was sent here by the office for evaluation. She first noticed the warts 2 weeks ago. She has not tried treating the warts. She has no pain where the warts are located. No hx of HSV.  Associated symptoms:  Denies fever and chills Denies abdominal pain Denies vaginal bleeding Denies vaginal discharge Denies urinary complaints Denies GI complaints  PE: No data found. Gen: calm comfortable, NAD Resp: normal effort, no distress Heart: Regular rate Abd: Soft, NT, Pos BS x 4 Neuro: A&O x 4 Pelvic exam: NEFG, Physiologic/moderate amount of thin, white, malodorous discharge noted. Several condyloma present near perineum.   ROS, labs, PMH reviewed  Orders Placed This Encounter  Procedures  . Wet prep, genital  . Urinalysis, Routine w reflex microscopic Urine, Clean Catch  . Discharge patient   Meds ordered this encounter  Medications  . terconazole (TERAZOL 7) 0.4 % vaginal cream    Sig: Place 1 applicator vaginally at bedtime.    Dispense:  45 g    Refill:  0    Order Specific Question:   Supervising Provider    Answer:   Merrily Pew    MDM   Assessment 1. Yeast infection   2. 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 [redacted] weeks gestation of pregnancy      Plan: - Discharge home in stable condition - Follow-up as scheduled at your doctor's office or sooner as needed if symptoms worsen. - Return to maternity admissions symptoms worsen - Terazol 7 RX   Rasch, Artist Pais, NP 07/30/2020 2:41 PM

## 2020-07-28 LAB — GC/CHLAMYDIA PROBE AMP (~~LOC~~) NOT AT ARMC
Chlamydia: NEGATIVE
Comment: NEGATIVE
Comment: NORMAL
Neisseria Gonorrhea: NEGATIVE

## 2020-07-30 ENCOUNTER — Ambulatory Visit (INDEPENDENT_AMBULATORY_CARE_PROVIDER_SITE_OTHER): Payer: Medicaid Other | Admitting: Obstetrics and Gynecology

## 2020-07-30 ENCOUNTER — Other Ambulatory Visit: Payer: Self-pay

## 2020-07-30 ENCOUNTER — Encounter: Payer: Self-pay | Admitting: Obstetrics and Gynecology

## 2020-07-30 VITALS — BP 132/80 | HR 102 | Temp 97.8°F | Wt 202.8 lb

## 2020-07-30 DIAGNOSIS — Z3A34 34 weeks gestation of pregnancy: Secondary | ICD-10-CM | POA: Diagnosis not present

## 2020-07-30 DIAGNOSIS — O1203 Gestational edema, third trimester: Secondary | ICD-10-CM | POA: Diagnosis not present

## 2020-07-30 DIAGNOSIS — Z3400 Encounter for supervision of normal first pregnancy, unspecified trimester: Secondary | ICD-10-CM | POA: Diagnosis not present

## 2020-07-30 NOTE — Progress Notes (Signed)
LOW-RISK PREGNANCY OFFICE VISIT Patient name: Brandy Burgess MRN [id number]  Date of birth: [birth year] Chief Complaint:   Routine Prenatal Visit  History of Present Illness:   Brandy Burgess is a 20 y.o. G1P0 female at [redacted]w[redacted]d with an Estimated Date of Delivery: 09/07/20 being seen today for ongoing management of a low-risk pregnancy.  Today she reports occasional contractions and and pelvic pressure. She is moving to [place], [place] today to live with parents. She has an OB office picked out, but unable to establish care until they receive prenatal records from our office. Per Dorisann Frames, RN those records were faxed the day they were requested. Patient states she will call them after her appointment today to verify they have received those records. Contractions: Irregular. Vag. Bleeding: None.  Movement: Present. denies leaking of fluid. Review of Systems:   Pertinent items are noted in HPI Denies abnormal vaginal discharge w/ itching/odor/irritation, headaches, visual changes, shortness of breath, chest pain, abdominal pain, severe nausea/vomiting, or problems with urination or bowel movements unless otherwise stated above. Pertinent History Reviewed:  Reviewed past medical,surgical, social, obstetrical and family history.  Reviewed problem list, medications and allergies. Physical Assessment:   Vitals:   07/30/20 0838  BP: 132/80  Pulse: (!) 102  Temp: 97.8 F (36.6 C)  Weight: 202 lb 12.8 oz (92 kg)  Body mass index is 33.75 kg/m.        Physical Examination:   General appearance: Well appearing, and in no distress  Mental status: Alert, oriented to person, place, and time  Skin: Warm & dry  Cardiovascular: Normal heart rate noted  Respiratory: Normal respiratory effort, no distress  Abdomen: Soft, gravid, nontender  Pelvic: Cervical exam deferred         Extremities: Edema: Mild pitting, slight indentation  Fetal Status: Fetal Heart Rate (bpm): 151   Movement: Present     No results found for this or any previous visit (from the past 24 hour(s)).  Assessment & Plan:  1) Low-risk pregnancy G1P0 at [redacted]w[redacted]d with an Estimated Date of Delivery: 09/07/20   2) Supervision of normal first pregnancy, antepartum - Anticipatory guidance for GBS screening at 36 wks. Explained the test is important to be done at this time in pregnancy to ensure adequate treatment at the time of delivery. Explained that a positive result does not mean any harm to her, but can be harmful to the baby. Meaning that if baby is exposed to the bacteria for too long without antibiotics, the bay has the potential to develop pneumonia, septicemia, or spinal meningitis and could end up in the NICU. Also, explained that a cervical exam may be performed at the time of testing to get a baseline cervical check and make sure there is no preterm cervical dilation. - Advised to schedule an appointment here in 2 weeks in case she is not able to get transferred before her next visit. Patient states she will be able to return for an appointment in 2 weeks, if unable to get scheduled at the new OB office.  3) Edema during pregnancy in third trimester - bilateral ankles and feet - Advised to elevate legs as much as possible  - Reminded to check BP later this afternoon and report it back via My Chart. Also make sure to check daily and record in [facility].  4) [redacted] weeks gestation of pregnancy    Meds: No orders of the defined types were placed in this encounter.  Labs/procedures today: none  Plan:  Continue routine obstetrical care   Reviewed: Preterm labor symptoms and general obstetric precautions including but not limited to vaginal bleeding, contractions, leaking of fluid and fetal movement were reviewed in detail with the patient.  All questions were answered. Has home bp cuff. Check bp weekly, let us know if >140/90.   Follow-up: Return in about 2 weeks (around 08/13/2020) for Return OB w/GBS.  No orders  of the defined types were placed in this encounter.  Raelyn Mora MSN, CNM 07/30/2020 8:57 AM

## 2020-08-03 ENCOUNTER — Telehealth: Payer: Self-pay | Admitting: *Deleted

## 2020-08-03 NOTE — Telephone Encounter (Signed)
[facility] left voice message stating patient's blood pressure reading for Saturday 08/01/20 at 8:59 PM 158/86 and 10:17 PM 148/81, no symptoms reported.

## 2020-08-07 ENCOUNTER — Telehealth: Payer: Self-pay

## 2020-08-07 NOTE — Telephone Encounter (Signed)
TC from [employer] calling to make [place] aware patient blood pressure was 159/85 with some swelling.Contacted patient no answer or voice mail. Per office staff patient has relocated to another office for care.

## 2020-08-11 ENCOUNTER — Telehealth: Payer: Self-pay | Admitting: Obstetrics and Gynecology

## 2020-08-11 NOTE — Telephone Encounter (Signed)
Received a call from Ms. Rauch requesting to cancel her appointment because she had found another provider.

## 2020-08-13 ENCOUNTER — Encounter: Payer: Medicaid Other | Admitting: Obstetrics and Gynecology

## 2021-05-30 IMAGING — US US MFM OB COMP +14 WKS
1 series · 13 of 28 positions shown · non-contrast
Comparison: none

[Series 1: us mfm ob comp +14 wks · 13 of 75 slices shown]
[im 3/75]
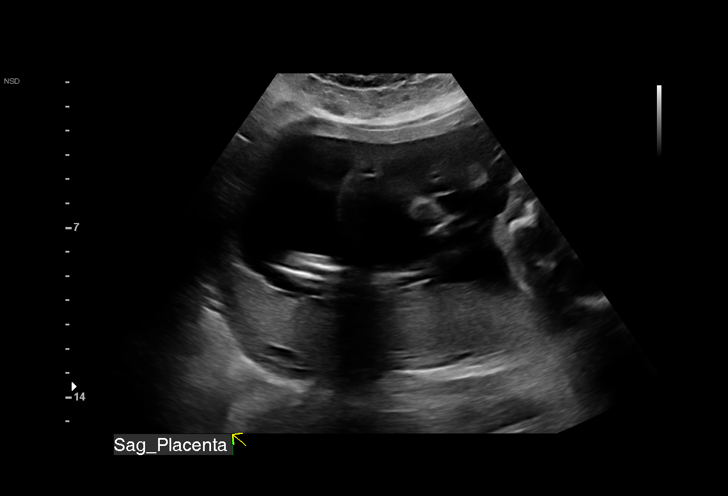
[im 9/75]
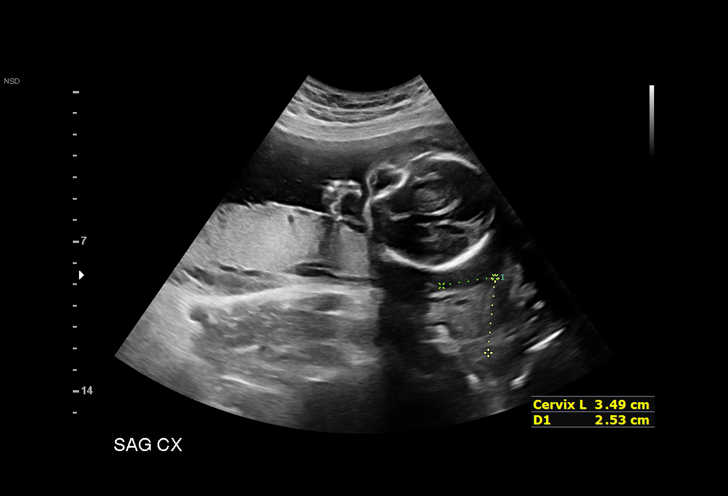
[im 14/75]
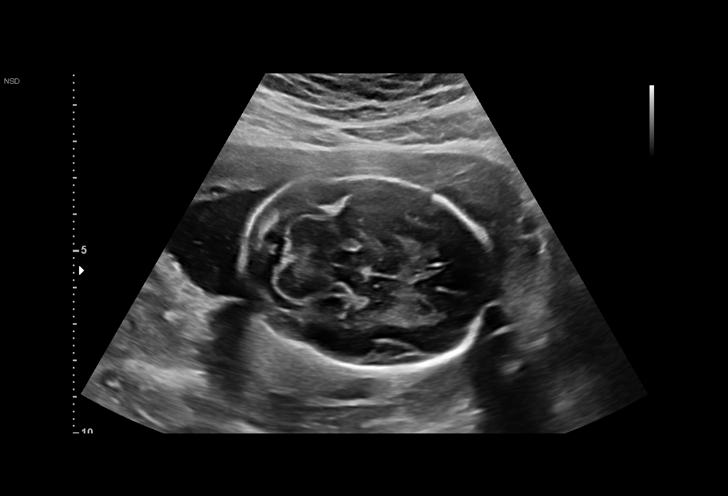
[im 20/75]
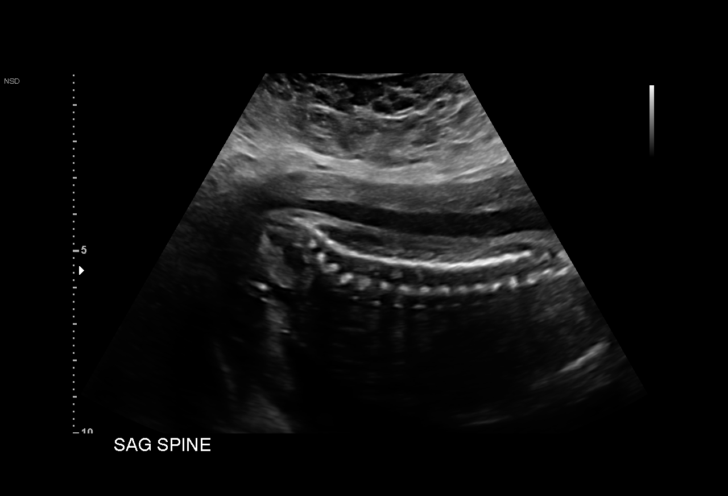
[im 25/75]
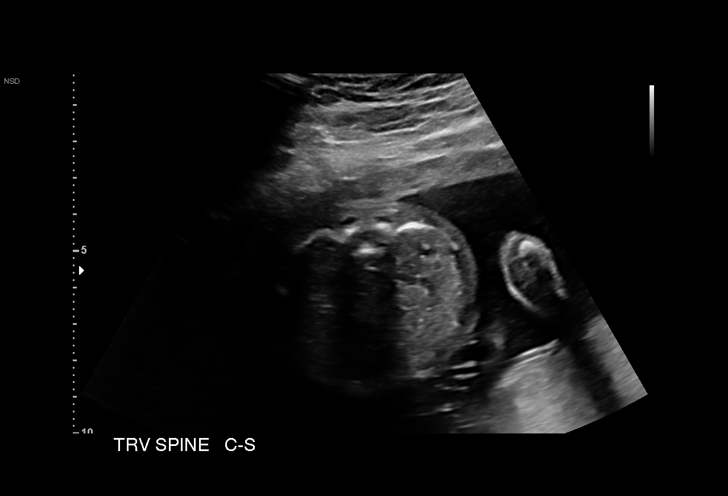
[im 31/75]
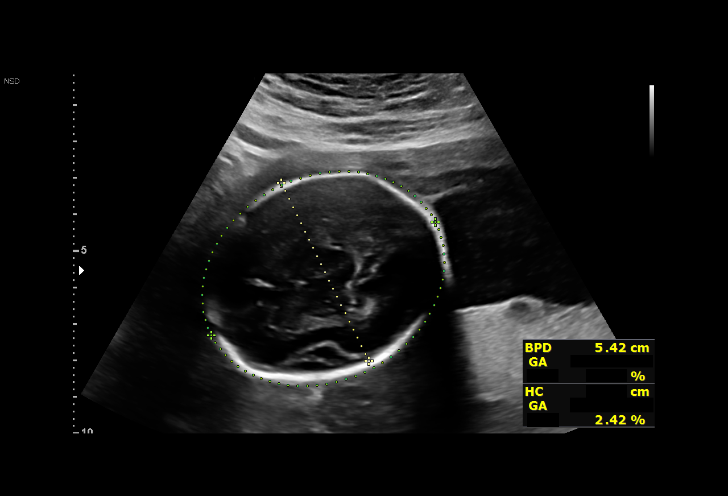
[im 39/75]
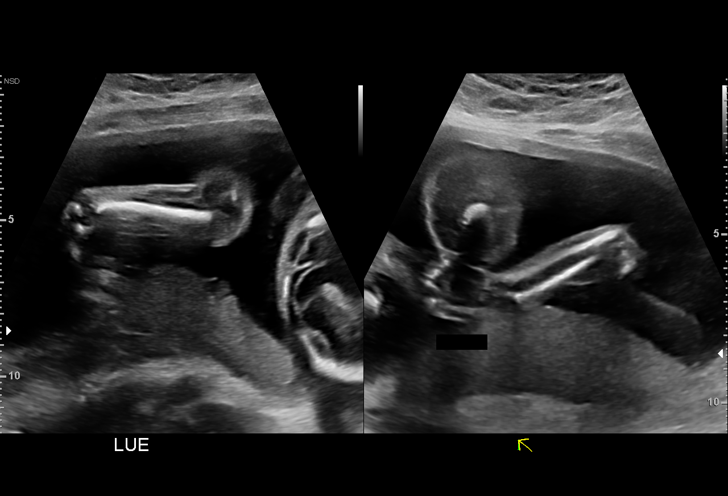
[im 44/75]
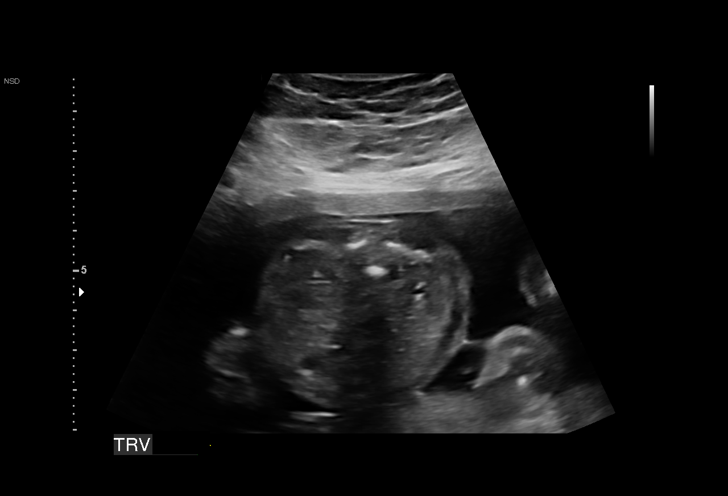
[im 50/75]
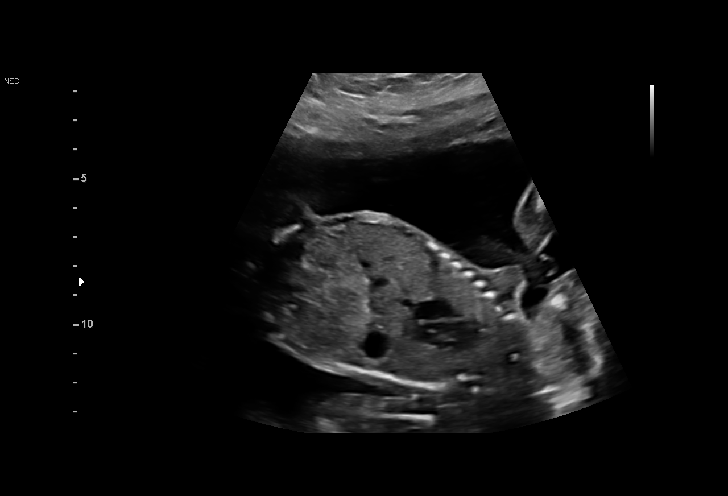
[im 55/75]
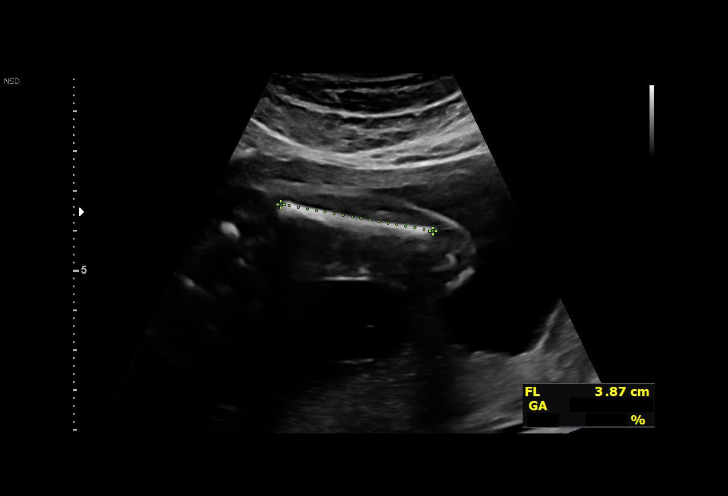
[im 61/75]
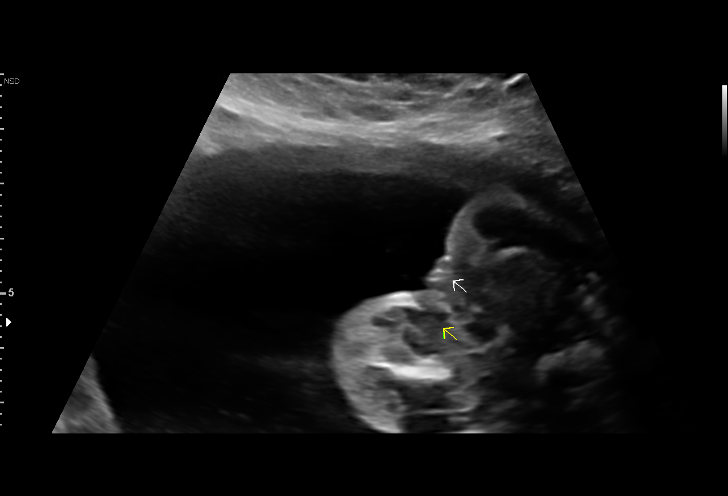
[im 66/75]
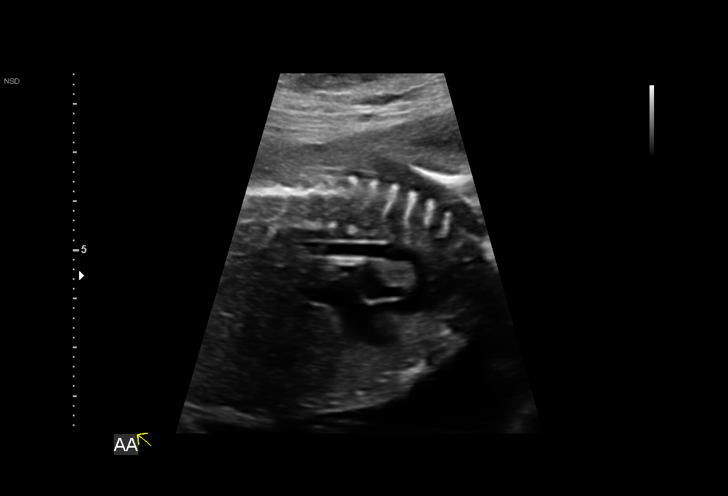
[im 72/75]
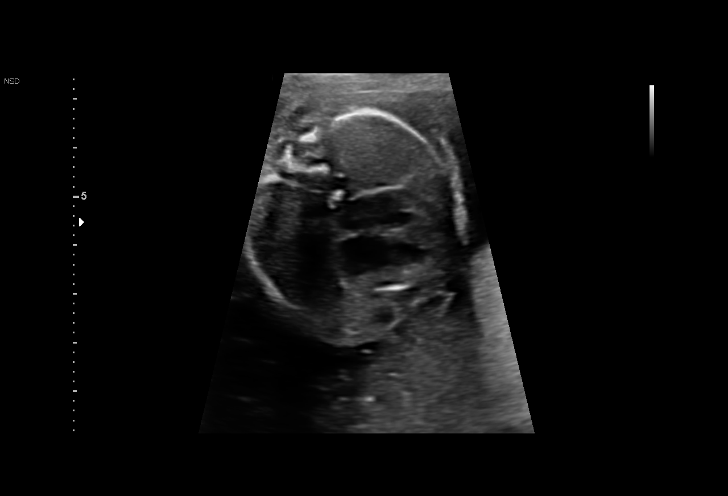

[13 of 28 positions shown; findings below may reference images not displayed]

1  US MFM OB COMP + 14 WK                76805.01    BRENDEN JACQUES

Indications

 Encounter for antenatal screening for
 malformations
 22 weeks gestation of pregnancy
 Negative AFP
Fetal Evaluation

 Num Of Fetuses:         1
 Fetal Heart Rate(bpm):  140
 Cardiac Activity:       Observed
 Presentation:           Cephalic
 Placenta:               Posterior
 P. Cord Insertion:      Visualized, central

 Amniotic Fluid
 AFI FV:      Within normal limits

                             Largest Pocket(cm)

Biometry

 BPD:      53.9  mm     G. Age:  22w 3d         45  %    CI:         75.5   %    70 - 86
                                                         FL/HC:      19.9   %    18.4 -
 HC:      196.7  mm     G. Age:  21w 6d         17  %    HC/AC:      1.09        1.06 -
 AC:      180.4  mm     G. Age:  22w 6d         57  %    FL/BPD:     72.7   %    71 - 87
 FL:       39.2  mm     G. Age:  22w 4d         45  %    FL/AC:      21.7   %    20 - 24
 HUM:      36.9  mm     G. Age:  22w 6d         57  %
 CER:      25.3  mm     G. Age:  23w 0d         87  %
 LV:        6.1  mm
 CM:        5.5  mm
 Est. FW:     523  gm      1 lb 2 oz     55  %
OB History

 Gravidity:    1         Term:   0        Prem:   0        SAB:   0
 TOP:          0       Ectopic:  0        Living: 0
Gestational Age

 LMP:           23w 2d        Date:  12/02/19                 EDD:   09/07/20
 U/S Today:     22w 3d                                        EDD:   09/13/20
 Best:          22w 3d     Det. By:  U/S (05/13/20)           EDD:   09/13/20
Anatomy

 Cranium:               Appears normal         LVOT:                   Not well visualized
 Cavum:                 Not well visualized    Aortic Arch:            Appears normal
 Ventricles:            Appears normal         Ductal Arch:            Not well visualized
 Choroid Plexus:        Appears normal         Diaphragm:              Appears normal
 Cerebellum:            Appears normal         Stomach:                Appears normal, left
                                                                       sided
 Posterior Fossa:       Appears normal         Abdomen:                Appears normal
 Nuchal Fold:           Not applicable (>20    Abdominal Wall:         Not well visualized
                        wks GA)
 Face:                  Not well visualized    Cord Vessels:           Appears normal (3
                                                                       vessel cord)
 Lips:                  Appears normal         Kidneys:                Appear normal
 Palate:                Not well visualized    Bladder:                Appears normal
 Thoracic:              Appears normal         Spine:                  Appears normal
 Heart:                 Appears normal         Upper Extremities:      Appears normal
                        (4CH, axis, and
                        situs)
 RVOT:                  Not well visualized    Lower Extremities:      RLE not well seen.
                                                                       LLE appears WNL.

 Other:  Fetus appears to be female. Heels and 5th digit visualized.
         Technically difficult due to fetal position.
Cervix Uterus Adnexa

 Cervix
 Length:           3.59  cm.
 Normal appearance by transabdominal scan.
Impression

 Single intrauterine pregnancy here for a detailed anatomy
 dating by today's ultrasound due to uncertain LMP.
 Normal anatomy with measurements consistent with dates
 There is good fetal movement and amniotic fluid volume
 Suboptimal views of the fetal anatomy were obtained
 secondary to fetal position.

 Cell free DNA not drawn but desired
 AFP negative.
Recommendations

 Follow up growth in 4 weeks to complete fetal anatomy.

## 2021-07-02 IMAGING — US US MFM OB FOLLOW-UP
1 series · 13 of 28 positions shown · non-contrast
Comparison: none

[Series 1: us mfm ob follow-up · 83 acquisitions, 13 frames shown]
[im 4/83]
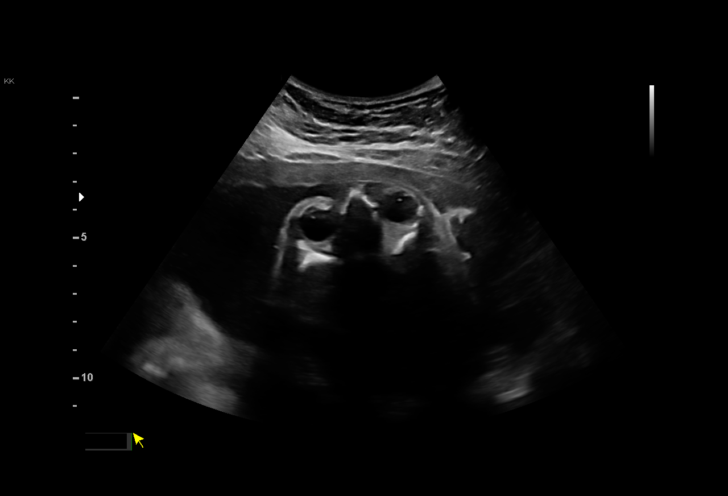
[im 10/83]
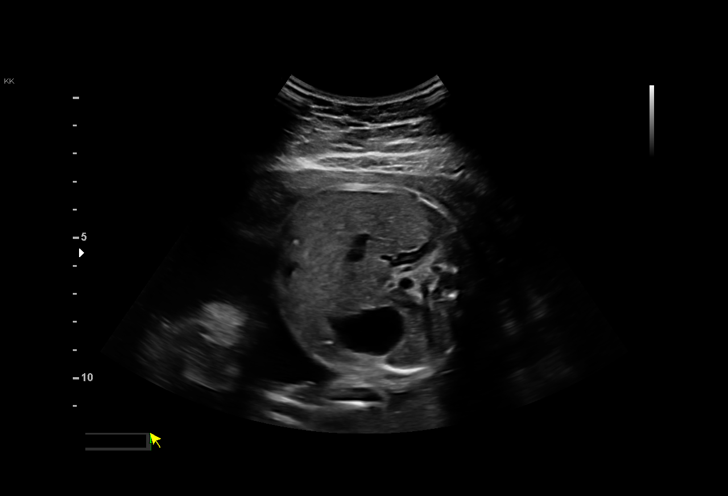
[im 16/83]
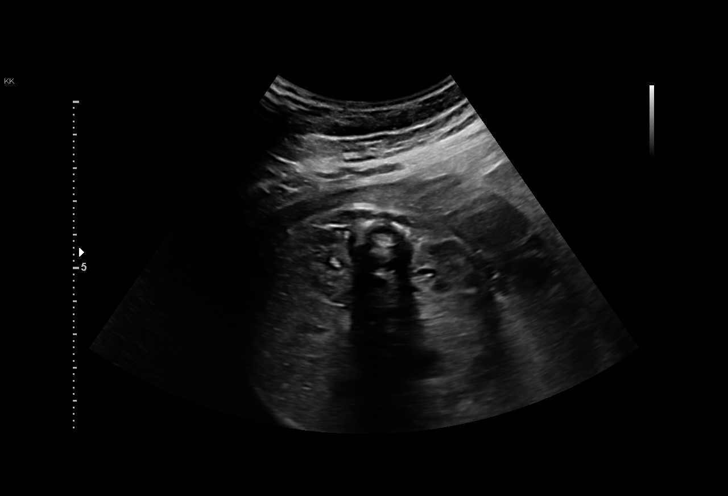
[im 22/83]
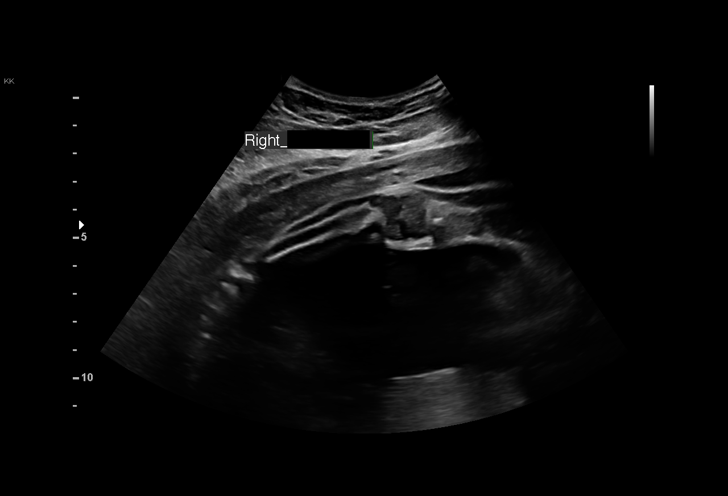
[im 28/83]
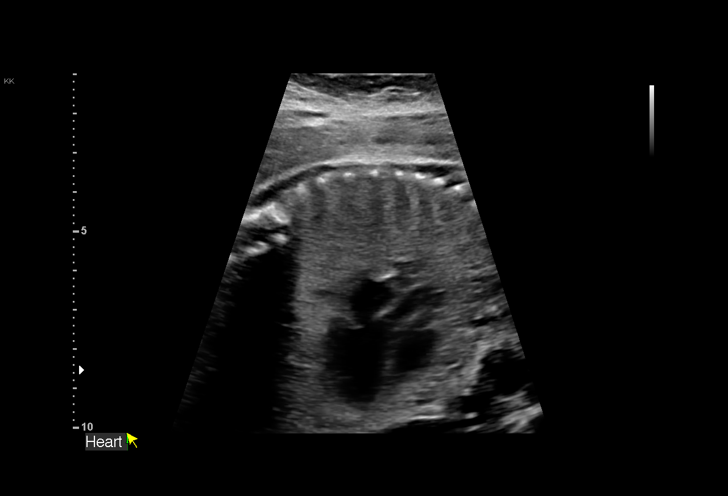
[im 34/83]
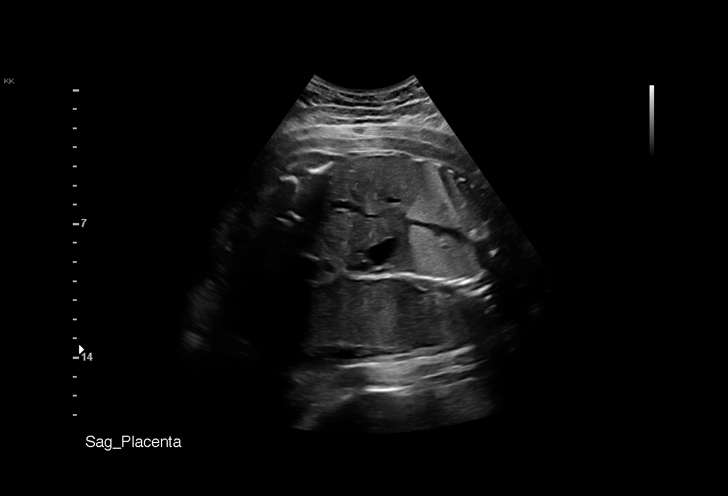
[im 43/83]
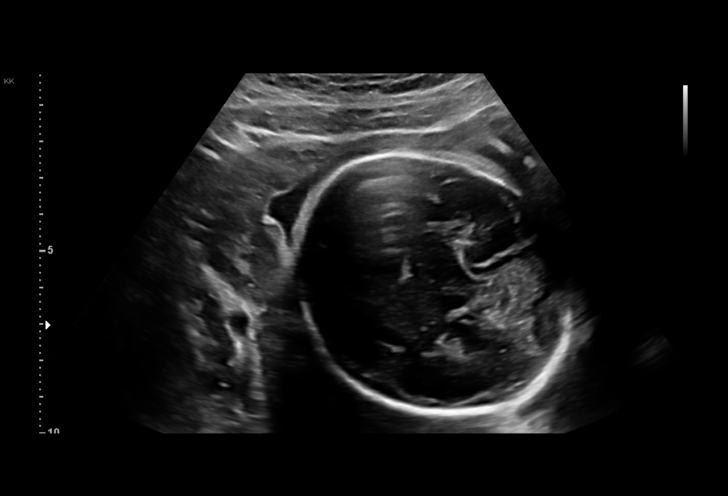
[im 49/83]
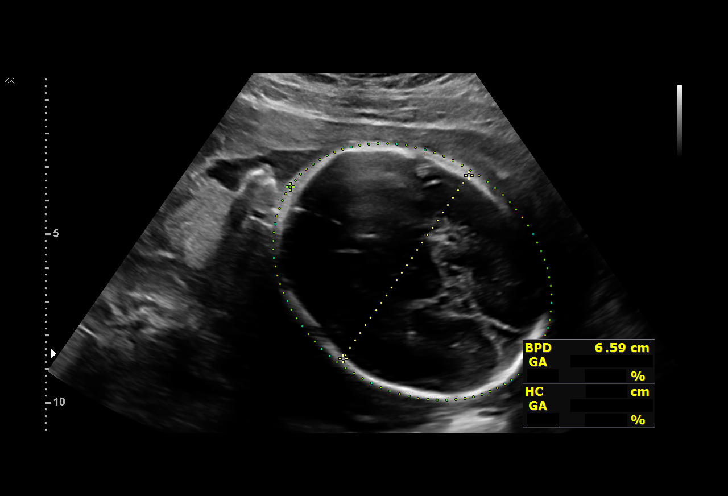
[im 55/83]
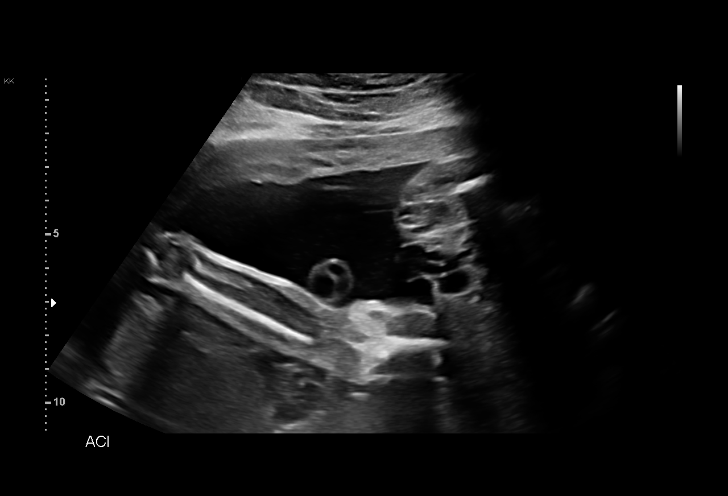
[im 61/83]
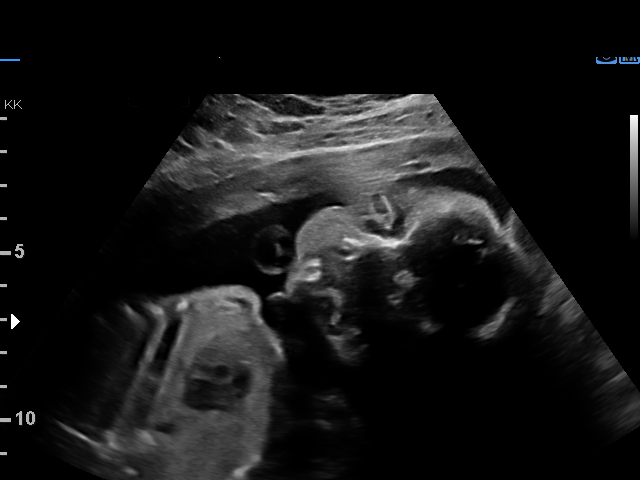
[im 67/83]
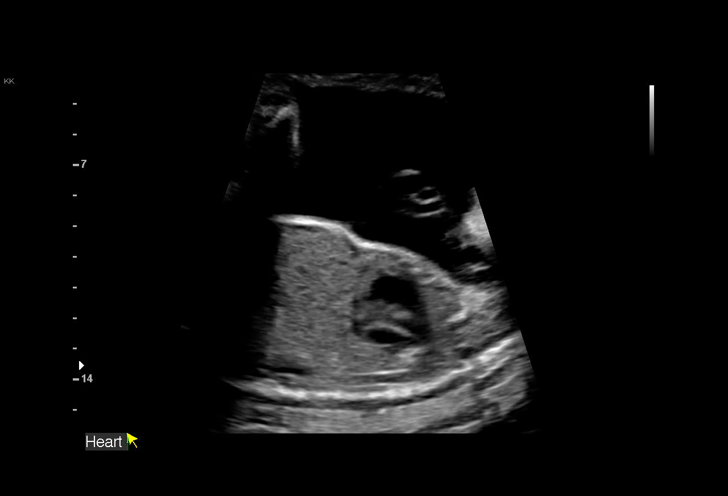
[im 73/83]
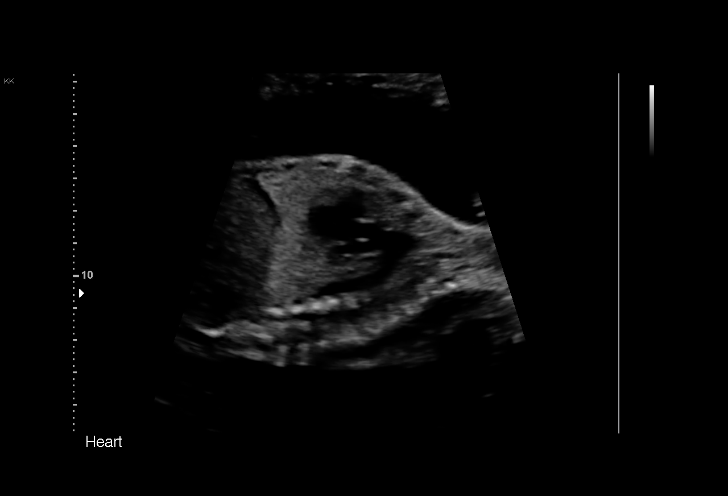
[im 79/83]
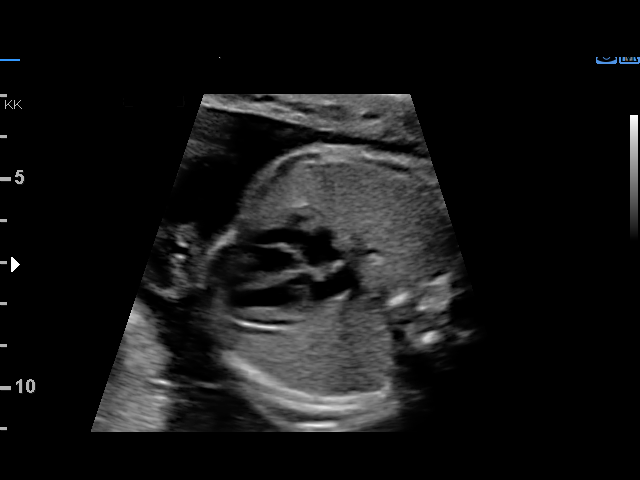

[13 of 28 positions shown; findings below may reference images not displayed]

KAFUI

Indications

 Antenatal follow-up for nonvisualized fetal
 anatomy
 Negative AFP
 27 weeks gestation of pregnancy
Fetal Evaluation

 Num Of Fetuses:         1
 Fetal Heart Rate(bpm):  166
 Cardiac Activity:       Observed
 Presentation:           Cephalic
 Placenta:               Posterior
 P. Cord Insertion:      Previously Visualized

 Amniotic Fluid
 AFI FV:      Within normal limits

                             Largest Pocket(cm)

Biometry

 BPD:      67.2  mm     G. Age:  27w 0d         36  %    CI:        73.22   %    70 - 86
                                                         FL/HC:      20.0   %    18.6 -
 HC:      249.6  mm     G. Age:  27w 1d         21  %    HC/AC:      1.07        1.05 -
 AC:      232.8  mm     G. Age:  27w 4d         57  %    FL/BPD:     74.3   %    71 - 87
 FL:       49.9  mm     G. Age:  26w 6d         27  %    FL/AC:      21.4   %    20 - 24

 Est. FW:    1171  gm      2 lb 5 oz     43  %
OB History
 Gravidity:    1         Term:   0        Prem:   0        SAB:   0
 TOP:          0       Ectopic:  0        Living: 0
Gestational Age

 LMP:           28w 0d        Date:  12/02/19                 EDD:   09/07/20
 U/S Today:     27w 1d                                        EDD:   09/13/20
 Best:          27w 1d     Det. By:  U/S  (05/13/20)          EDD:   09/13/20
Anatomy

 Cranium:               Appears normal         Aortic Arch:            Appears normal
 Cavum:                 Appears normal         Ductal Arch:            Appears normal
 Ventricles:            Appears normal         Diaphragm:              Appears normal
 Choroid Plexus:        Previously seen        Stomach:                Appears normal, left
                                                                       sided
 Cerebellum:            Appears normal         Abdomen:                Appears normal
 Posterior Fossa:       Previously seen        Abdominal Wall:         Appears nml (cord
                                                                       insert, abd wall)
 Nuchal Fold:           Not applicable (>20    Cord Vessels:           Previously seen
                        wks GA)
 Face:                  Appears normal         Kidneys:                Appear normal
                        (orbits and profile)
 Lips:                  Appears normal         Bladder:                Appears normal
 Thoracic:              Appears normal         Spine:                  Previously seen
 Heart:                 Appears normal         Upper Extremities:      Previously seen
                        (4CH, axis, and
                        situs)
 RVOT:                  Appears normal         Lower Extremities:      Appears normal
 LVOT:                  Appears normal

 Other:  Fetus appears to be female. Heels and 5th digit previously visualized.
         Technically difficult due to fetal position.
Cervix Uterus Adnexa

 Cervix
 Not visualized (advanced GA >11wks)
Impression

 Patient returned for completion of fetal anatomy .MSAFP
 screening showed low risk for open-neural tube defects . Cell-
 free fetal DNA screening result in pending (drawn on
 06/10/20).
 She does not have gestational diabetes.
 Blood pressures today at her office were 144/79 and 141/77.
 She does not have symptoms of severe features of
 preeclampsia.
 Patient reports she has blood pressure cuff at home and I
 advised her to check her blood pressures twice weekly.  I
 discussed the normal parameters.

 Fetal growth is appropriate for gestational age .Amniotic fluid
 is normal and good fetal activity is seen .Fetal anatomical
 survey was completed and appears normal.

 We reassured the patient of the findings.
Recommendations

 -Follow-up scans as clinically indicated.
 -If she develops gestational hypertension, I recommend serial
 fetal growth assessments and weekly antenatal testing from
 30 to 32 weeks gestation.
                 [place], [place]
